# Patient Record
Sex: Female | Born: 1985 | Race: White | Hispanic: No | Marital: Single | Smoking: Former smoker
Health system: Southern US, Community
[De-identification: ages and names within clinical notes are randomized; demographics above are authoritative.]

## PROBLEM LIST (undated history)

## (undated) ENCOUNTER — Emergency Department (HOSPITAL_COMMUNITY): Admission: EM | Payer: BC Managed Care – PPO | Source: Home / Self Care

## (undated) DIAGNOSIS — J45909 Unspecified asthma, uncomplicated: Secondary | ICD-10-CM

---
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
## 2007-04-27 ENCOUNTER — Emergency Department: Payer: Self-pay | Admitting: Emergency Medicine

## 2007-04-29 ENCOUNTER — Emergency Department: Payer: Self-pay | Admitting: Emergency Medicine

## 2008-11-29 IMAGING — CR DG CHEST 2V
1 series · 2 of 2 positions shown · non-contrast
Comparison: none

REASON FOR EXAM: Cough, fever
COMMENTS:

[Series 1: view not recorded · 0.17mm/px · 2 of 2 slices shown]
[im 1/2]
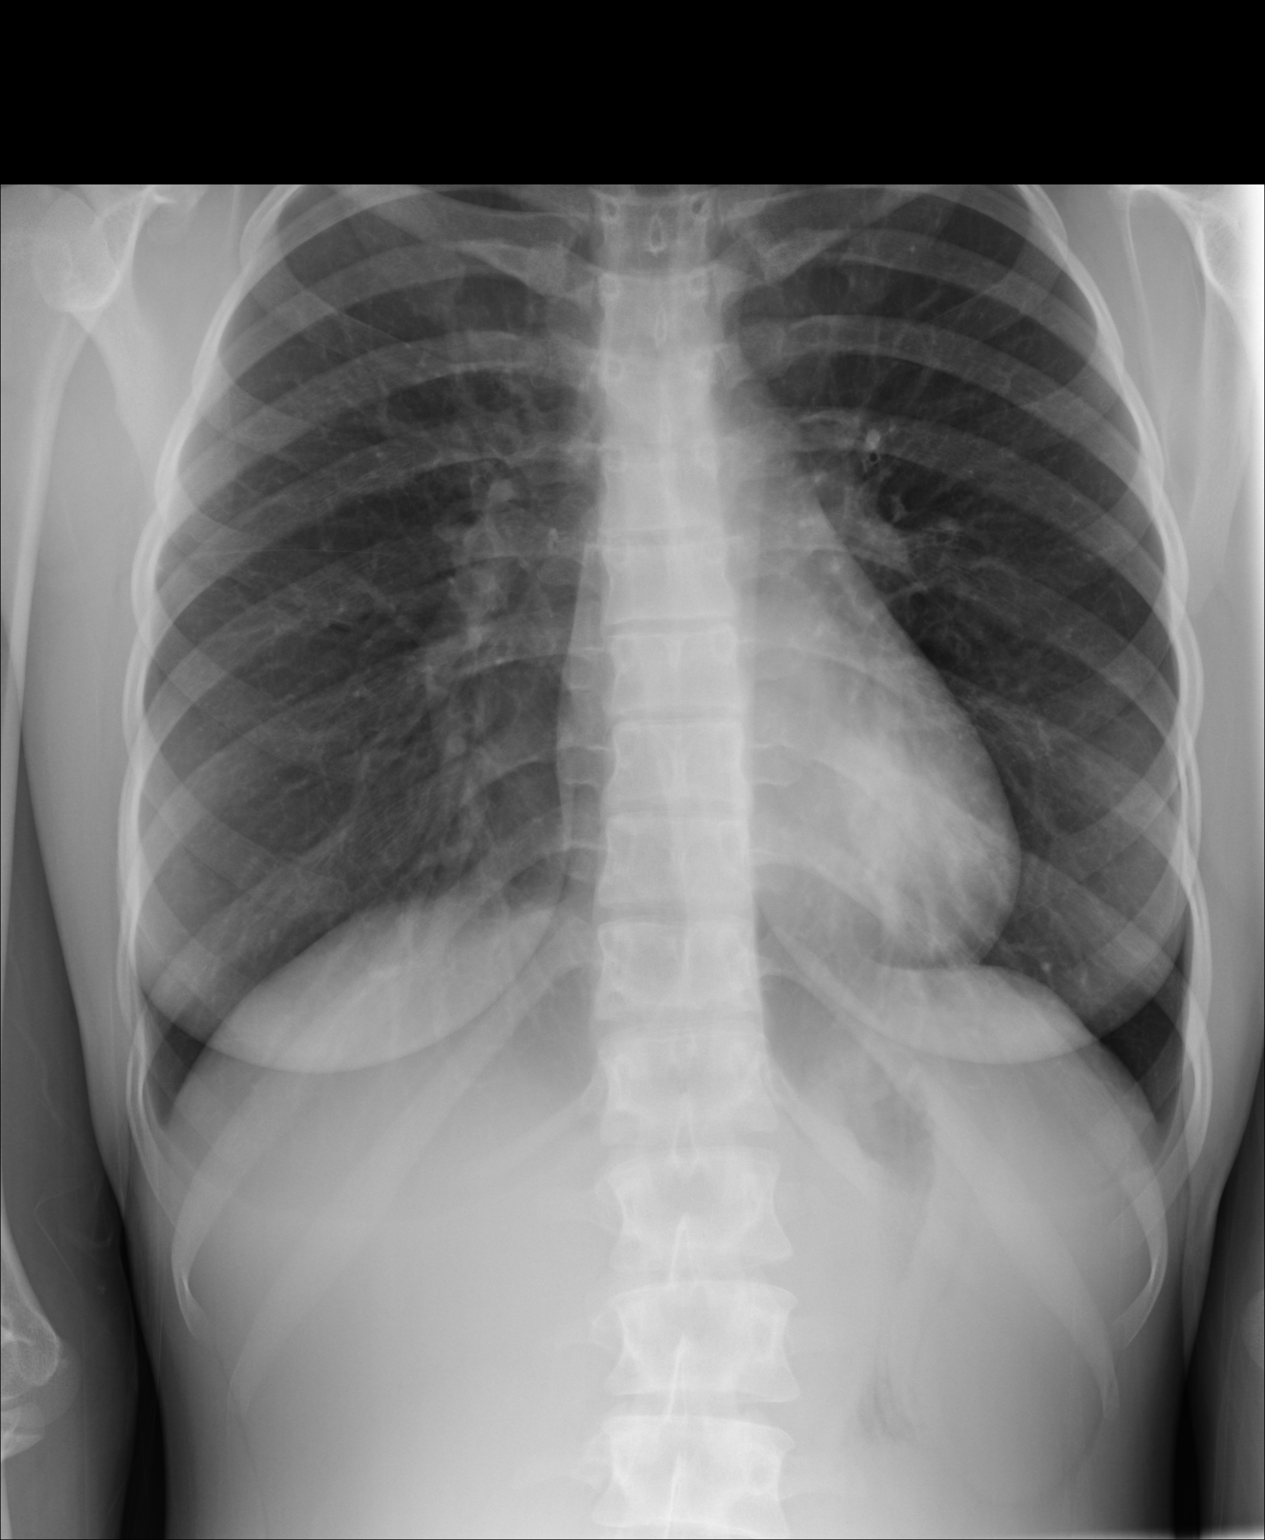
[im 2/2]
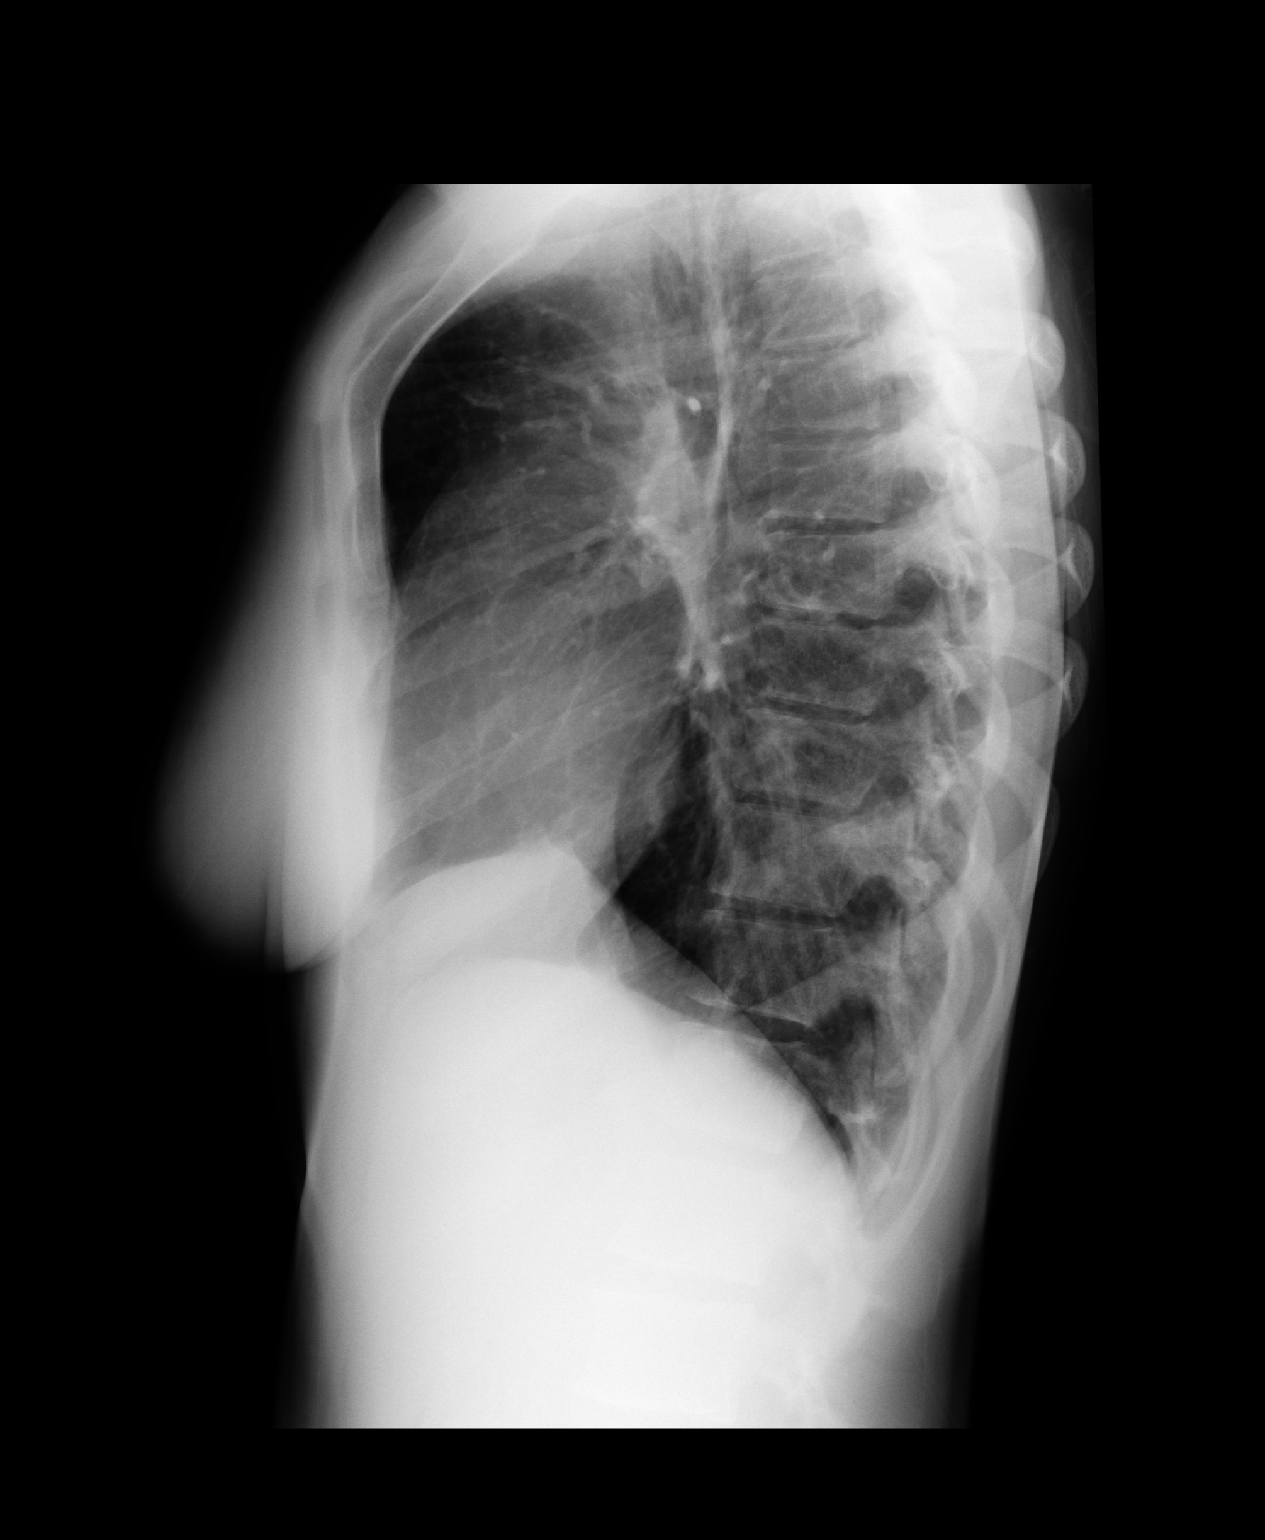

[2 of 2 positions shown; findings below may reference images not displayed]

PROCEDURE:     DXR - DXR CHEST PA (OR AP) AND LATERAL  - April 29, 2007  [DATE]

RESULT:     There is increased density in the LEFT base as visualized
through the cardiac silhouette. Some of the density is secondary to vascular
markings but in addition, there may be a superimposed focal infiltrate
posteriorly in the chest. The possibility of early pneumonia cannot be
excluded. Follow-up examination is recommended. The RIGHT lung field is
clear. The heart size is normal. The chest is hyperexpanded consistent with
reactive airway disease.
IMPRESSION: 1.  Probable early LEFT basilar pneumonia.
2.  The chest is hyperexpanded compatible with reactive airway disease.

## 2011-04-01 ENCOUNTER — Emergency Department: Payer: Self-pay | Admitting: Emergency Medicine

## 2011-10-22 ENCOUNTER — Emergency Department: Payer: Self-pay | Admitting: Emergency Medicine

## 2011-10-22 LAB — URINALYSIS, COMPLETE
Bilirubin,UR: NEGATIVE
Glucose,UR: NEGATIVE mg/dL (ref 0–75)
Nitrite: POSITIVE
Protein: 100
RBC,UR: 9 /HPF (ref 0–5)
Specific Gravity: 1.01 (ref 1.003–1.030)
Squamous Epithelial: 1

## 2011-10-22 LAB — COMPREHENSIVE METABOLIC PANEL
Alkaline Phosphatase: 122 U/L (ref 50–136)
BUN: 5 mg/dL — ABNORMAL LOW (ref 7–18)
Bilirubin,Total: 0.9 mg/dL (ref 0.2–1.0)
Chloride: 105 mmol/L (ref 98–107)
Creatinine: 0.55 mg/dL — ABNORMAL LOW (ref 0.60–1.30)
EGFR (African American): >60
Glucose: 102 mg/dL — ABNORMAL HIGH (ref 65–99)
SGOT(AST): 24 U/L (ref 15–37)
SGPT (ALT): 40 U/L (ref 12–78)
Sodium: 139 mmol/L (ref 136–145)
Total Protein: 7.7 g/dL (ref 6.4–8.2)

## 2011-10-22 LAB — CBC
HCT: 40.7 % (ref 35.0–47.0)
HGB: 14.6 g/dL (ref 12.0–16.0)
MCH: 31.4 pg (ref 26.0–34.0)
MCHC: 35.9 g/dL (ref 32.0–36.0)
MCV: 87 fL (ref 80–100)
RDW: 12.7 % (ref 11.5–14.5)

## 2011-10-22 LAB — LIPASE, BLOOD: Lipase: 77 U/L (ref 73–393)

## 2011-12-28 ENCOUNTER — Emergency Department: Payer: Self-pay | Admitting: Unknown Physician Specialty

## 2011-12-28 LAB — URINALYSIS, COMPLETE
Bilirubin,UR: NEGATIVE
Glucose,UR: NEGATIVE mg/dL (ref 0–75)
Ketone: NEGATIVE
Leukocyte Esterase: NEGATIVE
Nitrite: NEGATIVE
Ph: 6 (ref 4.5–8.0)
Protein: NEGATIVE
RBC,UR: 3 /HPF (ref 0–5)
Specific Gravity: 1.023 (ref 1.003–1.030)

## 2011-12-28 LAB — COMPREHENSIVE METABOLIC PANEL
Albumin: 4.2 g/dL (ref 3.4–5.0)
Alkaline Phosphatase: 85 U/L (ref 50–136)
Anion Gap: 9 (ref 7–16)
BUN: 10 mg/dL (ref 7–18)
Chloride: 107 mmol/L (ref 98–107)
Co2: 24 mmol/L (ref 21–32)
Creatinine: 0.55 mg/dL — ABNORMAL LOW (ref 0.60–1.30)
EGFR (African American): >60
EGFR (Non-African Amer.): >60
Osmolality: 279 (ref 275–301)
Potassium: 4.2 mmol/L (ref 3.5–5.1)
SGOT(AST): 26 U/L (ref 15–37)
SGPT (ALT): 32 U/L (ref 12–78)
Sodium: 140 mmol/L (ref 136–145)
Total Protein: 7.1 g/dL (ref 6.4–8.2)

## 2011-12-28 LAB — DRUG SCREEN, URINE
Amphetamines, Ur Screen: NEGATIVE (ref ?–1000)
Barbiturates, Ur Screen: NEGATIVE (ref ?–200)
Benzodiazepine, Ur Scrn: NEGATIVE (ref ?–200)
MDMA (Ecstasy)Ur Screen: NEGATIVE (ref ?–500)
Methadone, Ur Screen: NEGATIVE (ref ?–300)
Phencyclidine (PCP) Ur S: NEGATIVE (ref ?–25)
Tricyclic, Ur Screen: NEGATIVE (ref ?–1000)

## 2011-12-28 LAB — CBC
HCT: 41.7 % (ref 35.0–47.0)
MCV: 88 fL (ref 80–100)
Platelet: 192 10*3/uL (ref 150–440)
RBC: 4.72 10*6/uL (ref 3.80–5.20)
RDW: 13 % (ref 11.5–14.5)
WBC: 11.8 10*3/uL — ABNORMAL HIGH (ref 3.6–11.0)

## 2011-12-28 LAB — PREGNANCY, URINE: Pregnancy Test, Urine: NEGATIVE m[IU]/mL

## 2013-01-27 HISTORY — PX: TUBAL LIGATION: SHX77

## 2013-02-08 ENCOUNTER — Observation Stay: Payer: Self-pay | Admitting: Obstetrics & Gynecology

## 2013-03-16 ENCOUNTER — Inpatient Hospital Stay: Payer: Self-pay | Admitting: Obstetrics & Gynecology

## 2013-03-16 LAB — CBC WITH DIFFERENTIAL/PLATELET
Basophil #: 0 10*3/uL (ref 0.0–0.1)
Basophil %: 0.3 %
Eosinophil #: 0.1 10*3/uL (ref 0.0–0.7)
Eosinophil %: 1.2 %
HCT: 37.6 % (ref 35.0–47.0)
HGB: 12.9 g/dL (ref 12.0–16.0)
LYMPHS ABS: 1.8 10*3/uL (ref 1.0–3.6)
Lymphocyte %: 16.3 %
MCH: 31.9 pg (ref 26.0–34.0)
MCHC: 34.4 g/dL (ref 32.0–36.0)
MCV: 93 fL (ref 80–100)
MONO ABS: 0.9 x10 3/mm (ref 0.2–0.9)
Monocyte %: 7.9 %
NEUTROS ABS: 8.5 10*3/uL — AB (ref 1.4–6.5)
Neutrophil %: 74.3 %
Platelet: 191 10*3/uL (ref 150–440)
RBC: 4.05 10*6/uL (ref 3.80–5.20)
RDW: 13.4 % (ref 11.5–14.5)
WBC: 11.4 10*3/uL — AB (ref 3.6–11.0)

## 2013-03-17 LAB — GC/CHLAMYDIA PROBE AMP

## 2013-03-19 LAB — HEMATOCRIT: HCT: 34.7 % — ABNORMAL LOW (ref 35.0–47.0)

## 2013-03-23 LAB — PATHOLOGY REPORT

## 2013-07-30 IMAGING — US US PELV - US TRANSVAGINAL
1 series · 13 of 19 positions shown · non-contrast
Comparison: none

REASON FOR EXAM: severe lower abdominal pain
COMMENTS:   May transport without cardiac monitor

[Series 1: us pelv - us transvaginal · 0.28mm/px · 13 of 19 slices shown]
[im 1/19]
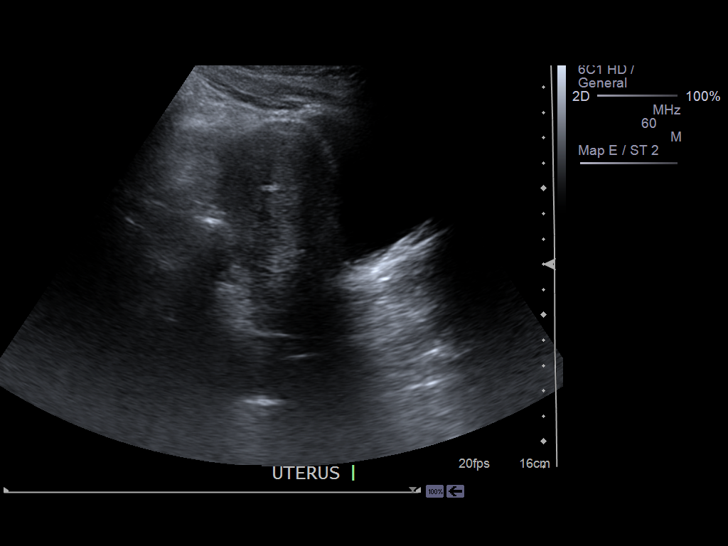
[im 3/19]
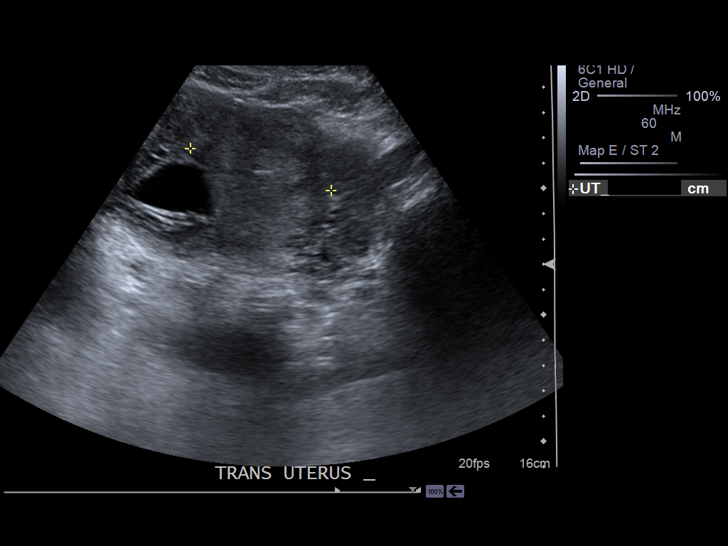
[im 4/19]
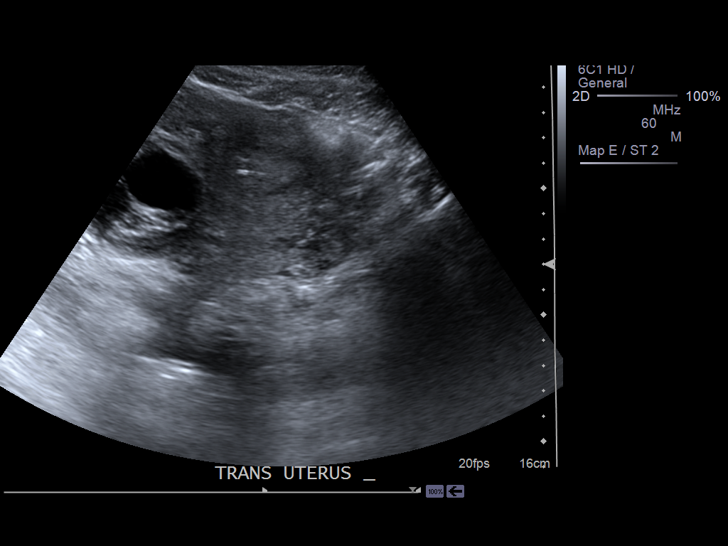
[im 6/19]
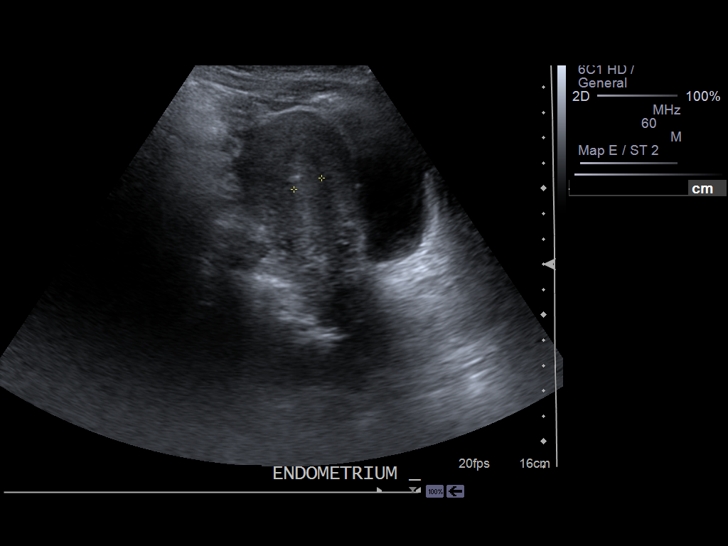
[im 7/19]
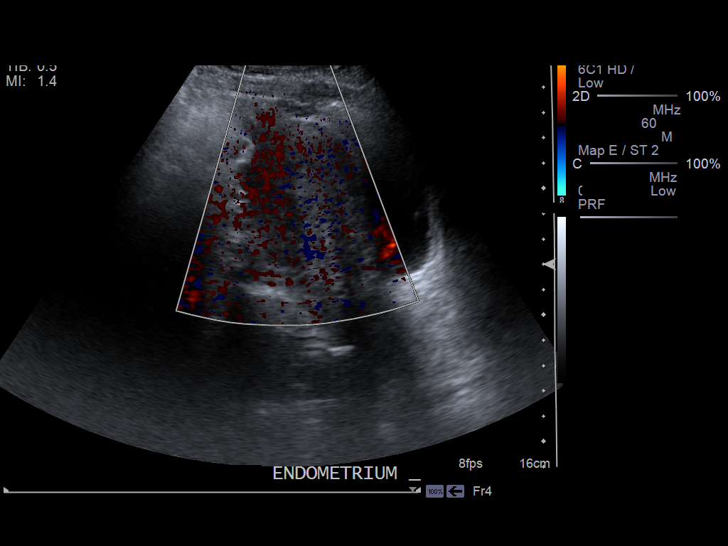
[im 9/19]
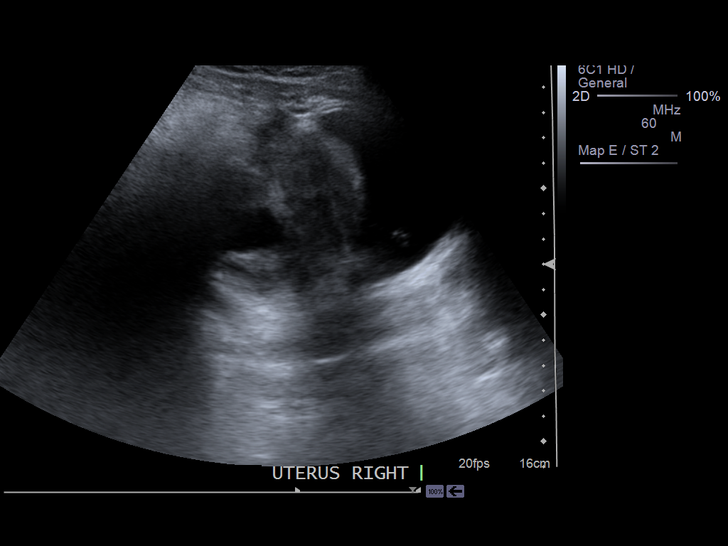
[im 10/19]
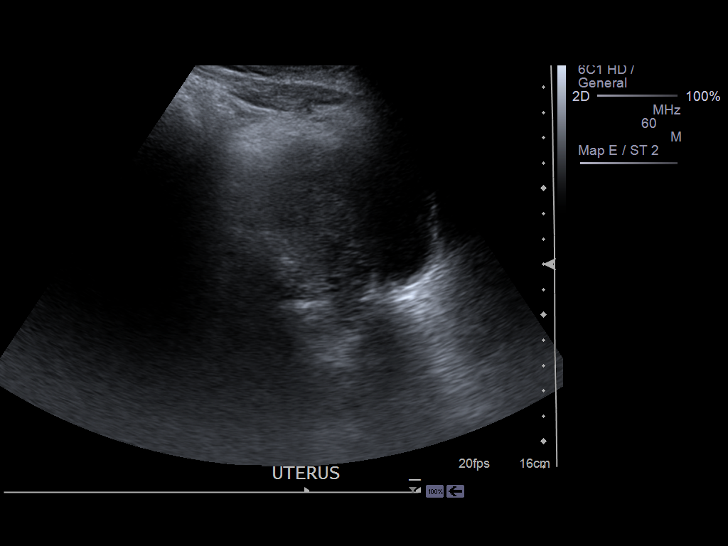
[im 11/19]
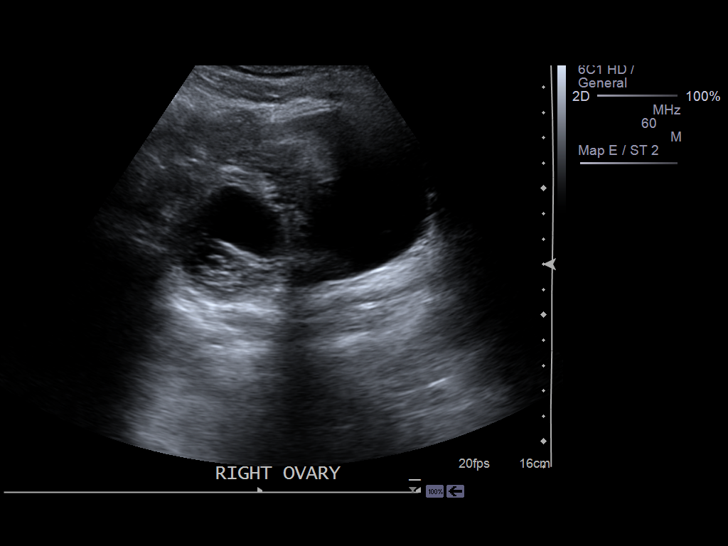
[im 13/19]
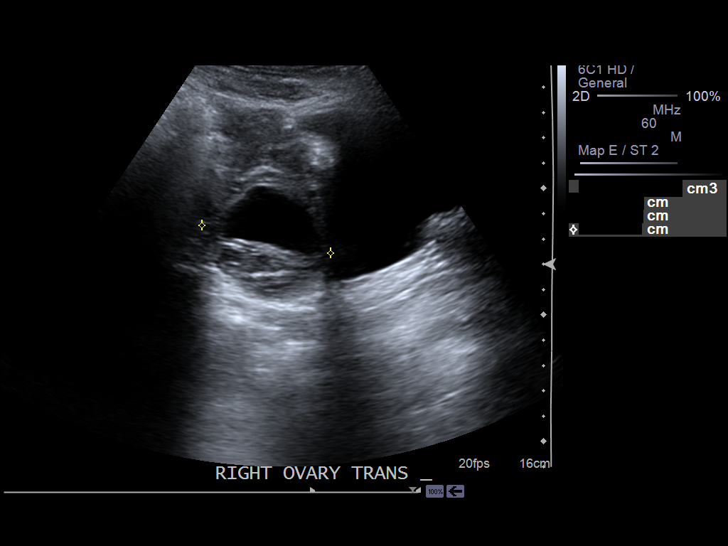
[im 14/19]
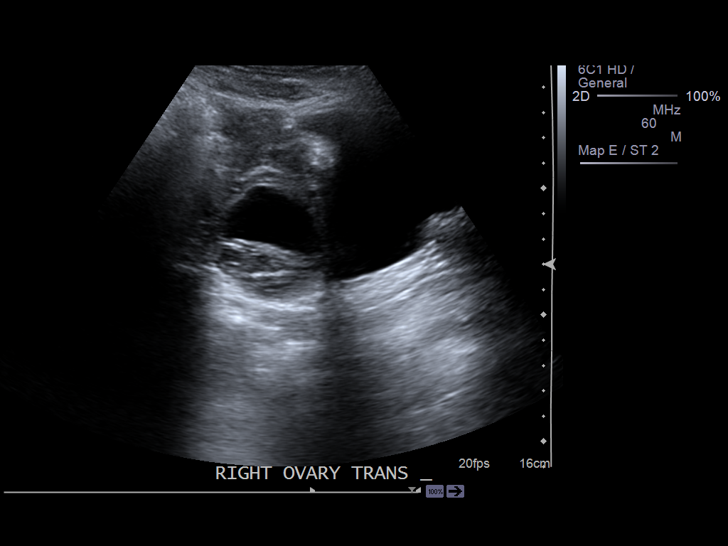
[im 16/19]
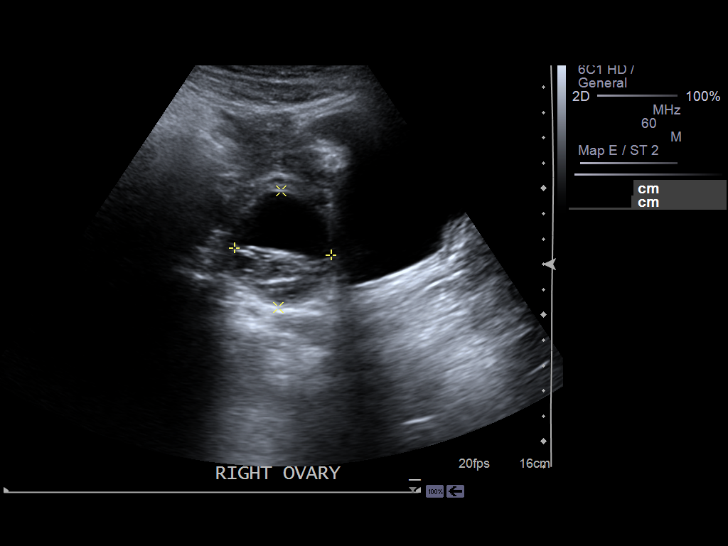
[im 17/19]
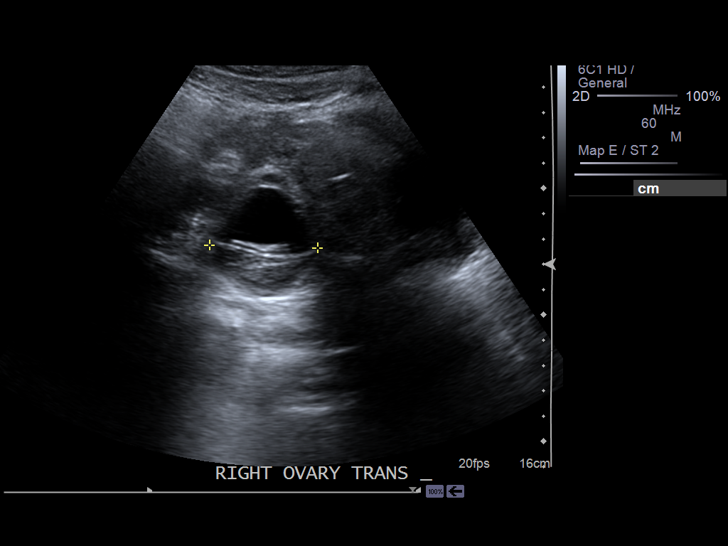
[im 19/19]
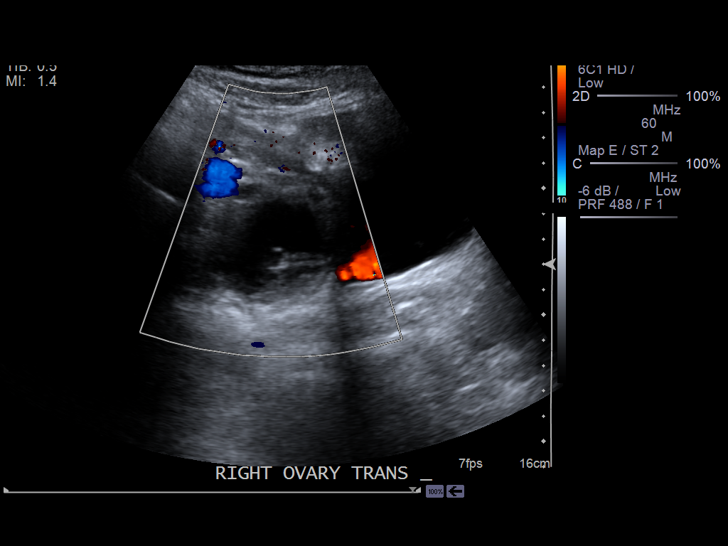

[13 of 19 positions shown; findings below may reference images not displayed]

PROCEDURE:     US  - US PELVIS EXAM W/TRANSVAGINAL  - December 28, 2011  [DATE]

RESULT:     Transabdominal and transvaginal imaging was performed through
the pelvis.

The uterus measures 8.5 x 5.8 x 5 cm. The endometrial stripe measures
mm. There is an IUD in place. There is free fluid in the cul-de-sac and
Morrison's pouch.

The right ovary contains a mixed echogenicity mass measuring 6.6 x 3.4 x
cm. It does not appear hypervascular. The right ovary measures 7.6 x 4.6 x
5.7 cm. The left ovary is normal in echotexture and contour and measures
x 2 x 2.4 cm. Incidental note is made of a possible gallstone.
IMPRESSION: 1. There is an abnormal mass associated with the enlarged right ovary. It
demonstrates mixed echogenicity and is not hypervascular as might be seen
with an ectopic pregnancy. The findings may reflect a complicated
hemorrhagic cyst or could reflect a tubo-ovarian abscess or endometrioma in
the appropriate clinical setting. The left ovary is normal in appearance.
2. The uterus is normal in contour. There is an IUD in place.
3. There is free fluid in the pelvis and in Morison's pouch.
4. There may be a gallstone present.

[REDACTED]

## 2014-05-20 NOTE — Op Note (Signed)
PATIENT NAME:  Nancy [name], Nancy [name] MR#:  [id number] DATE OF BIRTH:  [birth year]  DATE OF PROCEDURE:  03/19/2013  PREOPERATIVE DIAGNOSIS: Desire for permanent sterility.   POSTOPERATIVE DIAGNOSIS: Desire for permanent sterility.   PROCEDURE PERFORMED: Postpartum bilateral tubal ligation using the Pomeroy technique.   SURGEON: Annamarie MajorPaul Mariapaula Krist, M.D.   ANESTHESIA: General.   ESTIMATED BLOOD LOSS: Minimal.   COMPLICATIONS: None.   SPECIMEN: Portion of left and right fallopian tubes.   DISPOSITION: To recovery room, stable.   TECHNIQUE: The patient is prepped and draped in the usual sterile fashion after adequate anesthesia is obtained in the supine position on the operating room table. Marcaine 0.5% is used to anesthetize the skin just inferior to the umbilicus followed by [name] small elliptical incision in the same area. Fascia is identified and tented anteriorly and incised using Mayo scissors. Examination reveals no adhesions or bowel in this vicinity.  The uterus is noted to be firm and at the level of the umbilicus at this point in her recovery. The patient is placed in Trendelenburg positioning and the right fallopian tube is grasped with [name] Babcock clamp and identified out to its fimbria. [name] midportion is then tied with 2 Vicryl sutures, excised, and cauterized. The left fallopian tube is identified and grasped with Babcock clamp and visualized out to its fimbria. The midportion loop is then tied with 2 Vicryl sutures, excised, and cauterized. The patient is then leveled and the rectus fascia is closed with [name] 2-0 Vicryl suture. Subcutaneous tissues are irrigated and the skin is closed with 4-0 Vicryl suture followed by Dermabond. The patient goes to the recovery room in stable condition having tolerated the procedure well. All sponge, instrument, and needle counts are correct.     ____________________________ R. Annamarie MajorPaul Amyiah Gaba, MD rph:dp D: 03/19/2013 09:22:48 ET T: 03/19/2013 10:14:37  ET JOB#: [id number]  cc: Dierdre Searles. Paul Erico Stan, MD, <Dictator> Nadara MustardOBERT P Tykiera Raven MD ELECTRONICALLY SIGNED 03/25/2013 12:38

## 2014-06-06 NOTE — H&P (Signed)
L&D Evaluation:  History Expanded:  HPI 29 yo G4P3 at 7535 5/[redacted] weeks EGA w Prenatal Care in [place] but in process of transferring [facility], w vag pressure as well as rectal P and back pain; has been constipated w no BM in a few days.   No othwer complications.  Prior Normal Spontaneous Vaginal Delivery x3.   Gravida 4   Term 3   Patient's Medical History No Chronic Illness   Patient's Surgical History none   Medications Pre Natal Vitamins   Allergies NKDA   Social History none   Family History Non-Contributory   ROS:  ROS All systems were reviewed.  HEENT, CNS, GI, GU, Respiratory, CV, Renal and Musculoskeletal systems were found to be normal.   Exam:  Vital Signs stable   General no apparent distress   Mental Status clear   Abdomen gravid, non-tender   Estimated Fetal Weight Average for gestational age   Back no CVAT   Edema no edema   Pelvic no external lesions, cervix closed and thick   Mebranes Intact   FHT normal rate with no decels   Ucx absent   Impression:  Impression Constipation   Plan:  Plan monitor contractions and for cervical change, Dulcolax recommended at home.  F/U [facility]   Follow Up Appointment need to schedule   Electronic Signatures: Letitia LibraHarris, Robert Paul (MD)  (Signed 13-Jan-15 20:13)  Authored: L&D Evaluation   Last Updated: 13-Jan-15 20:13 by Letitia LibraHarris, Robert Paul (MD)
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
## 2021-02-18 ENCOUNTER — Emergency Department (HOSPITAL_BASED_OUTPATIENT_CLINIC_OR_DEPARTMENT_OTHER)
Admission: EM | Admit: 2021-02-18 | Discharge: 2021-02-18 | Disposition: A | Discharge status: Home / Self Care | Payer: Medicaid Other | Attending: Emergency Medicine | Admitting: Emergency Medicine

## 2021-02-18 ENCOUNTER — Encounter (HOSPITAL_BASED_OUTPATIENT_CLINIC_OR_DEPARTMENT_OTHER): Payer: Self-pay

## 2021-02-18 ENCOUNTER — Emergency Department (HOSPITAL_BASED_OUTPATIENT_CLINIC_OR_DEPARTMENT_OTHER): Payer: Medicaid Other | Admitting: Radiology

## 2021-02-18 ENCOUNTER — Other Ambulatory Visit: Payer: Self-pay

## 2021-02-18 DIAGNOSIS — R0981 Nasal congestion: Secondary | ICD-10-CM | POA: Insufficient documentation

## 2021-02-18 DIAGNOSIS — J189 Pneumonia, unspecified organism: Secondary | ICD-10-CM

## 2021-02-18 DIAGNOSIS — R059 Cough, unspecified: Secondary | ICD-10-CM | POA: Insufficient documentation

## 2021-02-18 DIAGNOSIS — J45909 Unspecified asthma, uncomplicated: Secondary | ICD-10-CM | POA: Insufficient documentation

## 2021-02-18 DIAGNOSIS — F17200 Nicotine dependence, unspecified, uncomplicated: Secondary | ICD-10-CM | POA: Insufficient documentation

## 2021-02-18 DIAGNOSIS — Z20822 Contact with and (suspected) exposure to covid-19: Secondary | ICD-10-CM | POA: Insufficient documentation

## 2021-02-18 LAB — RESP PANEL BY RT-PCR (FLU A&B, COVID) ARPGX2
Influenza A by PCR: NEGATIVE
Influenza B by PCR: NEGATIVE
SARS Coronavirus 2 by RT PCR: NEGATIVE

## 2021-02-18 MED ORDER — BENZONATATE 100 MG PO CAPS: 100.0000 mg | ORAL_CAPSULE | Freq: Three times a day (TID) | ORAL | 0 refills | Status: DC

## 2021-02-18 MED ORDER — DOXYCYCLINE HYCLATE 100 MG PO CAPS: 100.0000 mg | ORAL_CAPSULE | Freq: Two times a day (BID) | ORAL | 0 refills | Status: DC

## 2021-02-18 MED ORDER — AMOXICILLIN 500 MG PO CAPS: 1000.0000 mg | ORAL_CAPSULE | Freq: Three times a day (TID) | ORAL | 0 refills | Status: DC

## 2021-02-18 NOTE — ED Triage Notes (Signed)
Pt presents to the ED with cough. States that she went to the hospital 2 weeks ago and was prescribed prednisone. States that she was negative for covid, strep throat, and flu at that time. Pt states that she is still experiencing dry cough, ear ache, and chills. Pt A&Ox4 at time of triage. VSS.

## 2021-02-18 NOTE — ED Provider Notes (Signed)
[facility] EMERGENCY DEPT Provider Note   CSN: [id number] Arrival date & time: 02/18/21  1826     History  Chief Complaint  Patient presents with   Cough    Nancy [name] is a 36 y.o. female.  36 year old female presents with URI symptoms times several days.  Has had cough and congestion.  No vomiting or diarrhea.  She does use tobacco products and does have history of asthma.  Denies any severe dyspnea on exertion.  No treatment use prior to arrival.      Home Medications Prior to Admission medications   Not on File      Allergies    Doxylamine and Pseudoephedrine-ibuprofen    Review of Systems   Review of Systems  All other systems reviewed and are negative.  Physical Exam Updated Vital Signs BP 128/74 (BP Location: Left Arm)    Pulse (!) 114    Temp 99.3 F (37.4 C)    Resp 18    Ht 1.651 m (5\' 5" )    Wt 69.9 kg    LMP 02/18/2021 (Exact Date)    SpO2 98%    BMI 25.63 kg/m  Physical Exam Vitals and nursing note reviewed.  Constitutional:      General: She is not in acute distress.    Appearance: Normal appearance. She is well-developed. She is not toxic-appearing.  HENT:     Head: Normocephalic and atraumatic.  Eyes:     General: Lids are normal.     Conjunctiva/sclera: Conjunctivae normal.     Pupils: Pupils are equal, round, and reactive to light.  Neck:     Thyroid: No thyroid mass.     Trachea: No tracheal deviation.  Cardiovascular:     Rate and Rhythm: Normal rate and regular rhythm.     Heart sounds: Normal heart sounds. No murmur heard.   No gallop.  Pulmonary:     Effort: Pulmonary effort is normal. No respiratory distress.     Breath sounds: Normal breath sounds. No stridor. No decreased breath sounds, wheezing, rhonchi or rales.  Abdominal:     General: There is no distension.     Palpations: Abdomen is soft.     Tenderness: There is no abdominal tenderness. There is no rebound.  Musculoskeletal:        General: No  tenderness. Normal range of motion.     Cervical back: Normal range of motion and neck supple.  Skin:    General: Skin is warm and dry.     Findings: No abrasion or rash.  Neurological:     Mental Status: She is alert and oriented to person, place, and time. Mental status is at baseline.     GCS: GCS eye subscore is 4. GCS verbal subscore is 5. GCS motor subscore is 6.     Cranial Nerves: No cranial nerve deficit.     Sensory: No sensory deficit.     Motor: Motor function is intact.  Psychiatric:        Attention and Perception: Attention normal.        Speech: Speech normal.        Behavior: Behavior normal.    ED Results / Procedures / Treatments   Labs (all labs ordered are listed, but only abnormal results are displayed) Labs Reviewed  RESP PANEL BY RT-PCR (FLU A&B, COVID) ARPGX2    EKG None  Radiology DG Chest 2 View  Result Date: 02/18/2021 CLINICAL DATA:  Cough. EXAM: CHEST - 2  VIEW COMPARISON:  04/29/2007 FINDINGS: Moderate pectus excavatum deformity. Midline trachea. Normal heart size and mediastinal contours. No pleural effusion or pneumothorax. Pulmonary interstitial thickening is likely related to the clinical history of smoking. More confluent left base opacity, including retrocardiac density on the lateral view, suspicious for lobar pneumonia. IMPRESSION: Left lower lobe airspace disease, suspicious for pneumonia. Peribronchial thickening which may relate to chronic bronchitis or smoking. Electronically Signed   By: Jeronimo Greaves M.D.   On: 02/18/2021 19:48    Procedures Procedures    Medications Ordered in ED Medications - No data to display  ED Course/ Medical Decision Making/ A&P                           Medical Decision Making Amount and/or Complexity of Data Reviewed Radiology: ordered.   Chest x-ray consistent with pneumonia here.  She is not hypoxic.  COVID and flu test negative.  Will place patient on amoxicillin and  doxycycline        Final Clinical Impression(s) / ED Diagnoses Final diagnoses:  None    Rx / DC Orders ED Discharge Orders     None         Lorre Nick, MD 02/18/21 2126

## 2021-10-30 ENCOUNTER — Other Ambulatory Visit: Payer: Self-pay | Admitting: Family Medicine

## 2021-10-30 ENCOUNTER — Ambulatory Visit
Admission: RE | Admit: 2021-10-30 | Discharge: 2021-10-30 | Disposition: A | Discharge status: Home / Self Care | Payer: BC Managed Care – PPO | Source: Ambulatory Visit | Attending: Family Medicine | Admitting: Family Medicine

## 2021-10-30 DIAGNOSIS — R053 Chronic cough: Secondary | ICD-10-CM

## 2021-10-30 LAB — RESULTS CONSOLE HPV: CHL HPV: NEGATIVE

## 2021-10-30 LAB — HM PAP SMEAR

## 2021-12-31 ENCOUNTER — Other Ambulatory Visit (HOSPITAL_BASED_OUTPATIENT_CLINIC_OR_DEPARTMENT_OTHER): Payer: Self-pay

## 2021-12-31 ENCOUNTER — Emergency Department (HOSPITAL_BASED_OUTPATIENT_CLINIC_OR_DEPARTMENT_OTHER)
Admission: EM | Admit: 2021-12-31 | Discharge: 2021-12-31 | Disposition: A | Discharge status: Home / Self Care | Payer: Self-pay | Attending: Emergency Medicine | Admitting: Emergency Medicine

## 2021-12-31 ENCOUNTER — Emergency Department (HOSPITAL_BASED_OUTPATIENT_CLINIC_OR_DEPARTMENT_OTHER): Payer: Self-pay

## 2021-12-31 ENCOUNTER — Other Ambulatory Visit: Payer: Self-pay

## 2021-12-31 ENCOUNTER — Encounter (HOSPITAL_BASED_OUTPATIENT_CLINIC_OR_DEPARTMENT_OTHER): Payer: Self-pay | Admitting: Emergency Medicine

## 2021-12-31 DIAGNOSIS — K529 Noninfective gastroenteritis and colitis, unspecified: Secondary | ICD-10-CM

## 2021-12-31 DIAGNOSIS — Z1152 Encounter for screening for COVID-19: Secondary | ICD-10-CM | POA: Insufficient documentation

## 2021-12-31 DIAGNOSIS — K8020 Calculus of gallbladder without cholecystitis without obstruction: Secondary | ICD-10-CM

## 2021-12-31 LAB — CBC
HCT: 43.2 % (ref 36.0–46.0)
Hemoglobin: 14.9 g/dL (ref 12.0–15.0)
MCH: 30.4 pg (ref 26.0–34.0)
MCHC: 34.5 g/dL (ref 30.0–36.0)
MCV: 88.2 fL (ref 80.0–100.0)
Platelets: 222 10*3/uL (ref 150–400)
RBC: 4.9 MIL/uL (ref 3.87–5.11)
RDW: 12.5 % (ref 11.5–15.5)
WBC: 8.1 10*3/uL (ref 4.0–10.5)
nRBC: 0 % (ref 0.0–0.2)

## 2021-12-31 LAB — PREGNANCY, URINE: Preg Test, Ur: NEGATIVE

## 2021-12-31 LAB — COMPREHENSIVE METABOLIC PANEL
ALT: 17 U/L (ref 0–44)
AST: 19 U/L (ref 15–41)
Albumin: 4.8 g/dL (ref 3.5–5.0)
Alkaline Phosphatase: 67 U/L (ref 38–126)
Anion gap: 8 (ref 5–15)
BUN: 6 mg/dL (ref 6–20)
CO2: 25 mmol/L (ref 22–32)
Calcium: 10.4 mg/dL — ABNORMAL HIGH (ref 8.9–10.3)
Chloride: 105 mmol/L (ref 98–111)
Creatinine, Ser: 0.54 mg/dL (ref 0.44–1.00)
GFR, Estimated: >60 mL/min (ref >60)
Glucose, Bld: 91 mg/dL (ref 70–99)
Potassium: 3.9 mmol/L (ref 3.5–5.1)
Sodium: 138 mmol/L (ref 135–145)
Total Bilirubin: 0.6 mg/dL (ref 0.3–1.2)
Total Protein: 7.5 g/dL (ref 6.5–8.1)

## 2021-12-31 LAB — URINALYSIS, ROUTINE W REFLEX MICROSCOPIC
Bilirubin Urine: NEGATIVE
Glucose, UA: NEGATIVE mg/dL
Ketones, ur: NEGATIVE mg/dL
Leukocytes,Ua: NEGATIVE
Nitrite: NEGATIVE
RBC / HPF: >50 RBC/hpf — ABNORMAL HIGH (ref 0–5)
Specific Gravity, Urine: 1.009 (ref 1.005–1.030)
pH: 8 (ref 5.0–8.0)

## 2021-12-31 LAB — RESP PANEL BY RT-PCR (FLU A&B, COVID) ARPGX2
Influenza A by PCR: NEGATIVE
Influenza B by PCR: NEGATIVE
SARS Coronavirus 2 by RT PCR: NEGATIVE

## 2021-12-31 LAB — LIPASE, BLOOD: Lipase: 18 U/L (ref 11–51)

## 2021-12-31 MED ORDER — LOPERAMIDE HCL 2 MG PO CAPS
2.0000 mg | ORAL_CAPSULE | Freq: Four times a day (QID) | ORAL | 0 refills | Status: DC | PRN
  Filled 2021-12-31: qty 12, 3d supply, fill #0

## 2021-12-31 MED ORDER — LOPERAMIDE HCL 2 MG PO CAPS
2.0000 mg | ORAL_CAPSULE | Freq: Once | ORAL | Status: AC
  Administered 2021-12-31: 2 mg via ORAL
  Filled 2021-12-31: qty 1

## 2021-12-31 MED ORDER — DICYCLOMINE HCL 10 MG PO CAPS
10.0000 mg | ORAL_CAPSULE | Freq: Once | ORAL | Status: AC
  Administered 2021-12-31: 10 mg via ORAL
  Filled 2021-12-31: qty 1

## 2021-12-31 MED ORDER — ONDANSETRON 4 MG PO TBDP
4.0000 mg | ORAL_TABLET | Freq: Once | ORAL | Status: AC
  Administered 2021-12-31: 4 mg via ORAL
  Filled 2021-12-31: qty 1

## 2021-12-31 MED ORDER — DICYCLOMINE HCL 20 MG PO TABS
20.0000 mg | ORAL_TABLET | Freq: Two times a day (BID) | ORAL | 0 refills | Status: AC
  Filled 2021-12-31: qty 20, 10d supply, fill #0

## 2021-12-31 MED ORDER — ONDANSETRON 4 MG PO TBDP
4.0000 mg | ORAL_TABLET | Freq: Three times a day (TID) | ORAL | 0 refills | Status: DC | PRN
  Filled 2021-12-31: qty 20, 7d supply, fill #0

## 2021-12-31 NOTE — ED Notes (Signed)
Patient verbalizes understanding of discharge instructions. Opportunity for questioning and answers were provided. Patient discharged from ED.  °

## 2021-12-31 NOTE — ED Provider Notes (Signed)
[facility] EMERGENCY DEPT Provider Note   CSN: [id number] Arrival date & time: 12/31/21  1428     History  Chief Complaint  Patient presents with   Abdominal Pain    Nancy [name] is a 36 y.o. female.  With no significant PMH who presents with a few days of generalized fatigue associate with nausea and nonbloody nonbilious emesis, nonbloody diarrhea and lower abdominal discomfort in the setting of exposure to work colleagues with viral illness.  She has not been eating or drinking much because every time she eats or drinks she has nonbloody diarrhea.  She has had also episodes of nonbloody nonbilious emesis.  She has had some lower abdominal cramping and discomfort but also just recently started her menstrual period.  She denies any dysuria or hematuria or history of [name] stones.  She does have history of abdominal surgery but is still passing gas.  She also endorses some mild cough and congestion.  She has not taken any medications for her symptoms.   Abdominal Pain      Home Medications Prior to Admission medications   Medication Sig Start Date End Date Taking? Authorizing Provider  dicyclomine (BENTYL) 20 MG tablet Take 1 tablet (20 mg total) by mouth 2 (two) times daily. 12/31/21  Yes Mardene Sayer, MD  loperamide (IMODIUM) 2 MG capsule Take 1 capsule (2 mg total) by mouth 4 (four) times daily as needed for diarrhea or loose stools. 12/31/21  Yes Mardene Sayer, MD  ondansetron (ZOFRAN-ODT) 4 MG disintegrating tablet Take 1 tablet (4 mg total) by mouth every 8 (eight) hours as needed for nausea or vomiting. 12/31/21  Yes Mardene Sayer, MD  amoxicillin (AMOXIL) 500 MG capsule Take 2 capsules (1,000 mg total) by mouth 3 (three) times daily. 02/18/21   Lorre Nick, MD  benzonatate (TESSALON) 100 MG capsule Take 1 capsule (100 mg total) by mouth every 8 (eight) hours. 02/18/21   Lorre Nick, MD  doxycycline (VIBRAMYCIN) 100 MG capsule Take 1 capsule  (100 mg total) by mouth 2 (two) times daily. 02/18/21   Lorre Nick, MD      Allergies    Doxylamine, Pseudoephedrine-ibuprofen, and Vicks nyquil cough [doxylamine-dm]    Review of Systems   Review of Systems  Gastrointestinal:  Positive for abdominal pain.    Physical Exam Updated Vital Signs BP (!) 141/73   Pulse 81   Temp 98.6 F (37 C)   Resp 18   LMP 12/30/2021 (Exact Date)   SpO2 100%  Physical Exam Constitutional: Alert and oriented.  Nontoxic in no acute distress Eyes: Conjunctivae are normal. ENT      Head: Normocephalic and atraumatic.      Nose: No congestion.      Mouth/Throat: Mucous membranes are moist.      Neck: No stridor. Cardiovascular: S1, S2,  Normal and symmetric distal pulses are present in all extremities.Warm and well perfused. Respiratory: Normal respiratory effort. Breath sounds are normal.  O2 sat 100 RA Gastrointestinal: Soft and mild lower abdominal tenderness, no rebound or guarding, no CVAT Musculoskeletal: Normal range of motion in all extremities.      Right lower leg: No tenderness or edema.      Left lower leg: No tenderness or edema. Neurologic: Normal speech and language. No gross focal neurologic deficits are appreciated. Skin: Skin is warm, dry and intact. No rash noted. Psychiatric: Mood and affect are normal. Speech and behavior are normal.  ED Results / Procedures / Treatments  Labs (all labs ordered are listed, but only abnormal results are displayed) Labs Reviewed  COMPREHENSIVE METABOLIC PANEL - Abnormal; Notable for the following components:      Result Value   Calcium 10.4 (*)    All other components within normal limits  URINALYSIS, ROUTINE W REFLEX MICROSCOPIC - Abnormal; Notable for the following components:   Color, Urine ORANGE (*)    APPearance HAZY (*)    Hgb urine dipstick LARGE (*)    Protein, ur TRACE (*)    RBC / HPF >50 (*)    All other components within normal limits  RESP PANEL BY RT-PCR (FLU A&B,  COVID) ARPGX2  LIPASE, BLOOD  CBC  PREGNANCY, URINE    EKG None  Radiology CT Renal Stone Study  Result Date: 12/31/2021 CLINICAL DATA:  Lower abdominal pain and diarrhea since this morning, not feeling well, question [name] stone, history tubal ligation EXAM: CT ABDOMEN AND PELVIS WITHOUT CONTRAST TECHNIQUE: Multidetector CT imaging of the abdomen and pelvis was performed following the standard protocol without IV contrast. RADIATION DOSE REDUCTION: This exam was performed according to the departmental dose-optimization program which includes automated exposure control, adjustment of the mA and/or kV according to patient size and/or use of iterative reconstruction technique. COMPARISON:  None Available. FINDINGS: Lower chest: Lung bases clear Hepatobiliary: Calcified 6 mm gallstone at lower gallbladder segment. Gallbladder contracted but otherwise normal appearance. Liver normal appearance. Pancreas: Normal appearance Spleen: Normal appearance Adrenals/Urinary Tract: Adrenal glands, kidneys, ureters, and bladder normal appearance. No urinary tract calcification or dilatation. Stomach/Bowel: Normal appendix. Stomach and bowel loops unremarkable for technique Vascular/Lymphatic: Aorta normal caliber.  No adenopathy. Reproductive: Unremarkable uterus and adnexa Other: No free air or free fluid. No hernia or inflammatory process. Musculoskeletal: Unremarkable IMPRESSION: Cholelithiasis. No acute intra-abdominal or intrapelvic abnormalities. Electronically Signed   By: Ulyses Southward M.D.   On: 12/31/2021 17:02    Procedures Procedures    Medications Ordered in ED Medications  ondansetron (ZOFRAN-ODT) disintegrating tablet 4 mg (4 mg Oral Given 12/31/21 1705)  dicyclomine (BENTYL) capsule 10 mg (10 mg Oral Given 12/31/21 1705)  loperamide (IMODIUM) capsule 2 mg (2 mg Oral Given 12/31/21 1705)    ED Course/ Medical Decision Making/ A&P                           Medical Decision Making Nancy [name]  is a 36 y.o. female.  With no significant PMH who presents with a few days of generalized fatigue associate with nausea and nonbloody nonbilious emesis, nonbloody diarrhea and lower abdominal discomfort in the setting of exposure to work colleagues with viral illness.   In the setting of exposure to colleagues with viral illness, suspect likely viral gastroenteritis.  Also consider possible atypical diverticulitis, appendicitis or UTI/pyelonephritis.  Her labs were all generally reassuring and unremarkable.  She had a normal white blood cell count and 0.1, no anemia hemoglobin 14.9.  Lipase 18 within normal limits no concern for pancreatitis.  No transaminitis.  COVID and flu swabs were negative.  UA had large hemoglobin proteinuria and RBCs in the setting of menstrual period.  No findings suggestive of UTI or pyelonephritis.  CT renal stone study obtained with no acute intra-abdominal pathology which I personally reviewed.  Did show evidence of cholelithiasis without cholecystitis.  She has no upper abdominal tenderness no transaminitis, no concern for cholecystitis.  Provided patient Bentyl, Zofran and Imodium for symptom control.  Also provided prescriptions and  advised follow-up with PCP and return precautions.  She is in agreement with plan is for discharge at this time.  She is in agreement with this plan.  Amount and/or Complexity of Data Reviewed Labs: ordered. Radiology: ordered.  Risk Prescription drug management.    Final Clinical Impression(s) / ED Diagnoses Final diagnoses:  Gastroenteritis  Calculus of gallbladder without cholecystitis without obstruction    Rx / DC Orders ED Discharge Orders          Ordered    ondansetron (ZOFRAN-ODT) 4 MG disintegrating tablet  Every 8 hours PRN        12/31/21 1718    dicyclomine (BENTYL) 20 MG tablet  2 times daily        12/31/21 1718    loperamide (IMODIUM) 2 MG capsule  4 times daily PRN        12/31/21 1718               Mardene Sayer, MD 12/31/21 1718

## 2021-12-31 NOTE — ED Triage Notes (Signed)
Lower Abdominal pain and diarrhea started this morning. Just not feeling well.

## 2021-12-31 NOTE — Discharge Instructions (Addendum)
You have been seen in the Emergency Department (ED)  today for nausea and vomiting and diarrhea.  Your work up today has not shown a clear cause for your symptoms, but they may be due to a viral infection. You have been prescribed Zofran; please use as prescribed as needed for your nausea. You have been prescribed imodium for diarrhea and bentyl for abdominal cramping.  Your CT scan was generally reassuring. You do have gallstones but they are not causing any issue.  Follow up with your doctor as soon as possible, ideally within one week, regarding today's emergent visit and your symptoms of nausea/vomiting.   Return to the Emergency Department (ED)  if you develop severe abdominal pain, bloody vomiting, bloody diarrhea, if you are unable to tolerate fluids due to vomiting, or if you develop other symptoms that concern you.

## 2022-01-15 ENCOUNTER — Encounter: Payer: Self-pay | Admitting: Nurse Practitioner

## 2022-01-15 ENCOUNTER — Ambulatory Visit (INDEPENDENT_AMBULATORY_CARE_PROVIDER_SITE_OTHER): Payer: Self-pay | Admitting: Nurse Practitioner

## 2022-01-15 VITALS — BP 115/72 | HR 86 | Temp 98.0°F | Ht 65.0 in | Wt 162.0 lb

## 2022-01-15 DIAGNOSIS — Z09 Encounter for follow-up examination after completed treatment for conditions other than malignant neoplasm: Secondary | ICD-10-CM | POA: Insufficient documentation

## 2022-01-15 DIAGNOSIS — K8070 Calculus of gallbladder and bile duct without cholecystitis without obstruction: Secondary | ICD-10-CM | POA: Insufficient documentation

## 2022-01-15 DIAGNOSIS — L03113 Cellulitis of right upper limb: Secondary | ICD-10-CM | POA: Insufficient documentation

## 2022-01-15 DIAGNOSIS — K529 Noninfective gastroenteritis and colitis, unspecified: Secondary | ICD-10-CM | POA: Insufficient documentation

## 2022-01-15 MED ORDER — METHYLPREDNISOLONE 4 MG PO TBPK: ORAL_TABLET | ORAL | 0 refills | Status: DC

## 2022-01-15 MED ORDER — CEPHALEXIN 500 MG PO CAPS: 500.0000 mg | ORAL_CAPSULE | Freq: Four times a day (QID) | ORAL | 0 refills | Status: AC

## 2022-01-15 NOTE — Assessment & Plan Note (Signed)
Now resolved.  

## 2022-01-15 NOTE — Assessment & Plan Note (Signed)
Medrol dose 4 mg tablet ordered for inflammation. If there is no improvement after completion of Medrol Dosepak patient was told  to pick up the prescription for Keflex and take as prescribed .  Use of warm compresses encouraged.  Avoid scratching site to prevent infection.  Cellulitis of arm, right  - cephALEXin (KEFLEX) 500 MG capsule; Take 1 capsule (500 mg total) by mouth 4 (four) times daily for 5 days.  Dispense: 20 capsule; Refill: 0

## 2022-01-15 NOTE — Assessment & Plan Note (Signed)
presents for follow-up to the emergency room on 12/31/2021 for gastroenteritis.   At emergency room she was discharged home with dicyclomine 20 mg twice daily, Imodium 2 mg, 4 times daily as needed Zofran every 8 hours as needed. Today patient denies abdominal pain, nausea, vomiting, chills, fever.  She stated that she was told that she has gallbladder stone and that she should follow-up with [place].  [facility] but due to lack of insurance she decided to follow-up here.

## 2022-01-15 NOTE — Progress Notes (Signed)
New Patient Office Visit  Subjective:  Patient ID: Nancy [name], female    DOB: Jan 22, 1986  Age: 36 y.o. MRN: [id number]  CC:  Chief Complaint  Patient presents with   Establish Care    HPI Nancy [name] is a 36 y.o. female with past medical history of gastroenteritis, presents for follow-up to the emergency room on 12/31/2021 for gastroenteritis.   At emergency room she was discharged home with dicyclomine 20 mg twice daily, Imodium 2 mg, 4 times daily as needed Zofran every 8 hours as needed. Today patient denies abdominal pain, nausea, vomiting, chills, fever.  She stated that she was told that she has gallbladder stone and that she should follow-up with [place].  [facility] but due to lack of insurance she decided to follow-up here.   Patient complains of right arm swelling, itching, tenderness that started after she got flu vaccine at [facility] 5 days ago.  She denies fever, chills, malaise    Past Surgical History:  Procedure Laterality Date   TUBAL LIGATION Bilateral 2015    No family history on file.  Social History   Socioeconomic History   Marital status: Single    Spouse name: Not on file   Number of children: Not on file   Years of education: Not on file   Highest education level: Not on file  Occupational History   Not on file  Tobacco Use   Smoking status: Every Day    Packs/day: 1.00    Types: Cigarettes   Smokeless tobacco: Not on file  Substance and Sexual Activity   Alcohol use: Not Currently   Drug use: Never   Sexual activity: Not on file  Other Topics Concern   Not on file  Social History Narrative   Not on file   Social Determinants of Health   Financial Resource Strain: Not on file  Food Insecurity: Not on file  Transportation Needs: Not on file  Physical Activity: Not on file  Stress: Not on file  Social Connections: Not on file  Intimate Partner Violence: Not on file    ROS Review of Systems   Constitutional:  Negative for activity change, appetite change, chills, diaphoresis, fatigue and fever.  Respiratory: Negative.  Negative for apnea, cough, chest tightness, shortness of breath, wheezing and stridor.   Cardiovascular: Negative.  Negative for chest pain, palpitations and leg swelling.  Gastrointestinal:  Negative for abdominal distention, abdominal pain, anal bleeding, blood in stool, constipation, diarrhea, nausea and rectal pain.  Neurological: Negative.  Negative for dizziness, seizures, facial asymmetry, light-headedness, numbness and headaches.  Psychiatric/Behavioral: Negative.  Negative for agitation, behavioral problems, confusion, decreased concentration and dysphoric mood.     Objective:   Today's Vitals: BP 115/72   Pulse 86   Temp 98 F (36.7 C)   Ht 5\' 5"  (1.651 m)   Wt 162 lb (73.5 kg)   LMP 12/30/2021 (Exact Date)   SpO2 100%   BMI 26.96 kg/m   Physical Exam Constitutional:      General: She is not in acute distress.    Appearance: She is not ill-appearing, toxic-appearing or diaphoretic.  Eyes:     General: No scleral icterus.       Right eye: No discharge.        Left eye: No discharge.     Extraocular Movements: Extraocular movements intact.     Conjunctiva/sclera: Conjunctivae normal.  Cardiovascular:     Rate and Rhythm:  Normal rate and regular rhythm.     Pulses: Normal pulses.     Heart sounds: Normal heart sounds. No murmur heard.    No friction rub. No gallop.  Pulmonary:     Effort: Pulmonary effort is normal. No respiratory distress.     Breath sounds: Normal breath sounds. No stridor. No wheezing, rhonchi or rales.  Chest:     Chest wall: No tenderness.  Abdominal:     General: There is no distension.     Palpations: Abdomen is soft. There is no mass.     Tenderness: There is no abdominal tenderness. There is no right CVA tenderness, guarding or rebound.     Hernia: No hernia is present.  Skin:    General: Skin is warm and  dry.     Findings: Erythema present.     Comments: Right upper arm appears red and swollen, has  tenderness on palpation, has palpable radial pulse.  Sensation is intact.  No drainage noted  Neurological:     Mental Status: She is alert and oriented to person, place, and time.     Cranial Nerves: No cranial nerve deficit.     Motor: No weakness.     Coordination: Coordination normal.     Gait: Gait normal.  Psychiatric:        Mood and Affect: Mood normal.        Behavior: Behavior normal.        Thought Content: Thought content normal.        Judgment: Judgment normal.     Assessment & Plan:   Problem List Items Addressed This Visit       Digestive   Calculus of gallbladder and bile duct without cholecystitis or obstruction    Incidentally found on imaging study done at emergency room Patient denies abdominal pain, nausea, vomiting, fever, chills. I discussed with the patient that no indication for surgery at this time if she started experiencing abdominal pain she should discuss with her PCP for possible referral to GI for surgery      Gastroenteritis    Now resolved.         Other   Cellulitis of arm, right - Primary    Medrol dose 4 mg tablet ordered for inflammation. If there is no improvement after completion of Medrol Dosepak patient was told  to pick up the prescription for Keflex and take as prescribed .  Use of warm compresses encouraged.  Avoid scratching site to prevent infection.  Cellulitis of arm, right  - cephALEXin (KEFLEX) 500 MG capsule; Take 1 capsule (500 mg total) by mouth 4 (four) times daily for 5 days.  Dispense: 20 capsule; Refill: 0        Relevant Medications   cephALEXin (KEFLEX) 500 MG capsule   Encounter for examination following treatment at hospital    presents for follow-up to the emergency room on 12/31/2021 for gastroenteritis.   At emergency room she was discharged home with dicyclomine 20 mg twice daily, Imodium 2 mg, 4 times  daily as needed Zofran every 8 hours as needed. Today patient denies abdominal pain, nausea, vomiting, chills, fever.  She stated that she was told that she has gallbladder stone and that she should follow-up with [place].  [facility] but due to lack of insurance she decided to follow-up here.        Outpatient Encounter Medications as of 01/15/2022  Medication Sig   cephALEXin (KEFLEX)  500 MG capsule Take 1 capsule (500 mg total) by mouth 4 (four) times daily for 5 days.   methylPREDNISolone (MEDROL DOSEPAK) 4 MG TBPK tablet Take as instructed on packaging   benzonatate (TESSALON) 100 MG capsule Take 1 capsule (100 mg total) by mouth every 8 (eight) hours. (Patient not taking: Reported on 01/15/2022)   dicyclomine (BENTYL) 20 MG tablet Take 1 tablet (20 mg total) by mouth 2 (two) times daily. (Patient not taking: Reported on 01/15/2022)   loperamide (IMODIUM) 2 MG capsule Take 1 capsule (2 mg total) by mouth 4 (four) times daily as needed for diarrhea or loose stools. (Patient not taking: Reported on 01/15/2022)   ondansetron (ZOFRAN-ODT) 4 MG disintegrating tablet Take 1 tablet (4 mg total) by mouth every 8 (eight) hours as needed for nausea or vomiting.   [DISCONTINUED] amoxicillin (AMOXIL) 500 MG capsule Take 2 capsules (1,000 mg total) by mouth 3 (three) times daily. (Patient not taking: Reported on 01/15/2022)   [DISCONTINUED] doxycycline (VIBRAMYCIN) 100 MG capsule Take 1 capsule (100 mg total) by mouth 2 (two) times daily. (Patient not taking: Reported on 01/15/2022)   No facility-administered encounter medications on file as of 01/15/2022.    Follow-up: No follow-ups on file.   Donell Beers, FNP

## 2022-01-15 NOTE — Assessment & Plan Note (Signed)
Incidentally found on imaging study done at emergency room Patient denies abdominal pain, nausea, vomiting, fever, chills. I discussed with the patient that no indication for surgery at this time if she started experiencing abdominal pain she should discuss with her PCP for possible referral to GI for surgery

## 2022-01-15 NOTE — Patient Instructions (Signed)
 .   Cellulitis of arm, right - cephALEXin (KEFLEX) 500 MG capsule; Take 1 capsule (500 mg total) by mouth 4 (four) times daily for 5 days.  Dispense: 20 capsule; Refill: 0    It is important that you exercise regularly at least 30 minutes 5 times a week as tolerated  Think about what you will eat, plan ahead. Choose " clean, green, fresh or frozen" over canned, processed or packaged foods which are more sugary, salty and fatty. 70 to 75% of food eaten should be vegetables and fruit. Three meals at set times with snacks allowed between meals, but they must be fruit or vegetables. Aim to eat over a 12 hour period , example 7 am to 7 pm, and STOP after  your last meal of the day. Drink water,generally about 64 ounces per day, no other drink is as healthy. Fruit juice is best enjoyed in a healthy way, by EATING the fruit.  Thanks for choosing [facility] we consider it a privelige to serve you.

## 2022-03-09 ENCOUNTER — Encounter (HOSPITAL_COMMUNITY): Payer: Self-pay | Admitting: Emergency Medicine

## 2022-03-09 ENCOUNTER — Emergency Department (HOSPITAL_COMMUNITY)
Admission: EM | Admit: 2022-03-09 | Discharge: 2022-03-09 | Disposition: A | Discharge status: Home / Self Care | Payer: PRIVATE HEALTH INSURANCE | Attending: Emergency Medicine | Admitting: Emergency Medicine

## 2022-03-09 ENCOUNTER — Other Ambulatory Visit: Payer: Self-pay

## 2022-03-09 DIAGNOSIS — H6693 Otitis media, unspecified, bilateral: Secondary | ICD-10-CM | POA: Insufficient documentation

## 2022-03-09 DIAGNOSIS — H6690 Otitis media, unspecified, unspecified ear: Secondary | ICD-10-CM

## 2022-03-09 DIAGNOSIS — Z7951 Long term (current) use of inhaled steroids: Secondary | ICD-10-CM | POA: Diagnosis not present

## 2022-03-09 DIAGNOSIS — J069 Acute upper respiratory infection, unspecified: Secondary | ICD-10-CM | POA: Insufficient documentation

## 2022-03-09 DIAGNOSIS — Z1152 Encounter for screening for COVID-19: Secondary | ICD-10-CM | POA: Diagnosis not present

## 2022-03-09 DIAGNOSIS — J45909 Unspecified asthma, uncomplicated: Secondary | ICD-10-CM | POA: Diagnosis not present

## 2022-03-09 DIAGNOSIS — R509 Fever, unspecified: Secondary | ICD-10-CM | POA: Diagnosis present

## 2022-03-09 HISTORY — DX: Unspecified asthma, uncomplicated: J45.909

## 2022-03-09 LAB — RESP PANEL BY RT-PCR (RSV, FLU A&B, COVID)  RVPGX2
Influenza A by PCR: POSITIVE — AB
Influenza B by PCR: NEGATIVE
Resp Syncytial Virus by PCR: NEGATIVE
SARS Coronavirus 2 by RT PCR: NEGATIVE

## 2022-03-09 MED ORDER — AMOXICILLIN-POT CLAVULANATE 875-125 MG PO TABS: 1.0000 | ORAL_TABLET | Freq: Two times a day (BID) | ORAL | 0 refills | Status: DC

## 2022-03-09 MED ORDER — ALBUTEROL SULFATE HFA 108 (90 BASE) MCG/ACT IN AERS: 1.0000 | INHALATION_SPRAY | Freq: Four times a day (QID) | RESPIRATORY_TRACT | 0 refills | Status: AC | PRN

## 2022-03-09 MED ORDER — PREDNISONE 20 MG PO TABS: 40.0000 mg | ORAL_TABLET | Freq: Every day | ORAL | 0 refills | Status: AC

## 2022-03-09 NOTE — ED Triage Notes (Signed)
Pt c/o bilateral ear pain and fevers X4 days.  She has been taking tylenol every 6hours and still has not had relief.  Pt also c/o a productive cough. Last took tylenol at 10am.

## 2022-03-09 NOTE — Discharge Instructions (Addendum)
Please follow-up with your primary care doctor if you have worsening shortness of breath, oxygen saturations below 90% please return to the ER.

## 2022-03-09 NOTE — ED Provider Notes (Signed)
[facility] Provider Note   CSN: [id number] Arrival date & time: 03/09/22  1926     History  Chief Complaint  Patient presents with   Otalgia   Cough    Nancy [name] is a 37 y.o. female, history of asthma, who presents to the ED secondary to bilateral ear pain, over the last 4 days, associated fever.  She also states that she feels like her face is a bit swollen.  She states that she is having difficulty hearing out of her right ear, and that is extremely painful to the touch.  Started with runny nose, cough, and is just progressively gotten worse.  Has been taking Tylenol without any relief.  Also states that she has been more short of breath and has been using her albuterol inhaler multiple times a day without any relief.  Last took Tylenol at 10 AM today.  Denies any nausea, vomiting, diarrhea.     Home Medications Prior to Admission medications   Medication Sig Start Date End Date Taking? Authorizing Provider  albuterol (VENTOLIN HFA) 108 (90 Base) MCG/ACT inhaler Inhale 1-2 puffs into the lungs every 6 (six) hours as needed for wheezing or shortness of breath. 03/09/22  Yes Nusayba Cadenas L, PA  amoxicillin-clavulanate (AUGMENTIN) 875-125 MG tablet Take 1 tablet by mouth every 12 (twelve) hours. 03/09/22  Yes Ashlynn Gunnels L, PA  predniSONE (DELTASONE) 20 MG tablet Take 2 tablets (40 mg total) by mouth daily for 5 days. 03/09/22 03/14/22 Yes Ramal Eckhardt L, PA  benzonatate (TESSALON) 100 MG capsule Take 1 capsule (100 mg total) by mouth every 8 (eight) hours. Patient not taking: Reported on 01/15/2022 02/18/21   Lacretia Leigh, MD  dicyclomine (BENTYL) 20 MG tablet Take 1 tablet (20 mg total) by mouth 2 (two) times daily. Patient not taking: Reported on 01/15/2022 12/31/21   Elgie Congo, MD  loperamide (IMODIUM) 2 MG capsule Take 1 capsule (2 mg total) by mouth 4 (four) times daily as needed for diarrhea or loose stools. Patient not  taking: Reported on 01/15/2022 12/31/21   Elgie Congo, MD  methylPREDNISolone (MEDROL DOSEPAK) 4 MG TBPK tablet Take as instructed on packaging 01/15/22   Renee Rival, FNP  ondansetron (ZOFRAN-ODT) 4 MG disintegrating tablet Take 1 tablet (4 mg total) by mouth every 8 (eight) hours as needed for nausea or vomiting. 12/31/21   Elgie Congo, MD      Allergies    Doxylamine, Phenylephrine-dm-gg-apap, Pseudoephedrine-ibuprofen, and Vicks nyquil cough [doxylamine-dm]    Review of Systems   Review of Systems  Constitutional:  Positive for fever.  HENT:  Positive for ear pain.   Respiratory:  Positive for cough. Negative for shortness of breath.     Physical Exam Updated Vital Signs BP 123/64   Pulse 88   Temp 99.3 F (37.4 C)   Resp 18   Ht 5' 5"$  (1.651 m)   Wt 73.9 kg   SpO2 99%   BMI 27.12 kg/m  Physical Exam Vitals and nursing note reviewed.  Constitutional:      General: She is not in acute distress.    Appearance: She is well-developed.  HENT:     Head: Normocephalic and atraumatic.     Right Ear: Ear canal and external ear normal. Decreased hearing noted. Tenderness present. No drainage. No mastoid tenderness. Tympanic membrane is erythematous and bulging.     Left Ear: Tympanic membrane normal.     Nose:  Nose normal.     Mouth/Throat:     Mouth: Mucous membranes are moist.     Tonsils: No tonsillar exudate. 2+ on the right. 2+ on the left.  Eyes:     Conjunctiva/sclera: Conjunctivae normal.  Cardiovascular:     Rate and Rhythm: Normal rate and regular rhythm.     Heart sounds: No murmur heard. Pulmonary:     Effort: Pulmonary effort is normal. No respiratory distress.     Breath sounds: Normal breath sounds.  Abdominal:     Palpations: Abdomen is soft.     Tenderness: There is no abdominal tenderness.  Musculoskeletal:        General: No swelling.     Cervical back: Neck supple.  Skin:    General: Skin is warm and dry.     Capillary  Refill: Capillary refill takes less than 2 seconds.  Neurological:     Mental Status: She is alert.  Psychiatric:        Mood and Affect: Mood normal.     ED Results / Procedures / Treatments   Labs (all labs ordered are listed, but only abnormal results are displayed) Labs Reviewed  RESP PANEL BY RT-PCR (RSV, FLU A&B, COVID)  RVPGX2    EKG None  Radiology No results found.  Procedures Procedures   Medications Ordered in ED Medications - No data to display  ED Course/ Medical Decision Making/ A&P   {                            Medical Decision Making Patient is a 37 year old female, here for bilateral ear pain, fevers, runny nose, cough for the last 4 days.  She just states that she has been taking Tylenol without any relief of her symptoms.  Just states she feels bad.  States she had a negative COVID/flu a couple days ago.  On exam she has a positive bulging TM on the right side, concerning for otitis media.  No exudate in the canal.  We will start her on Augmentin.  Additionally she states she has been using her albuterol inhaler frequently, so we will start her on albuterol and prednisone for possible asthma exacerbation.  She is not wheezing on exam, and has nonlabored respirations.  Overall well-appearing.  Risk Prescription drug management.    Final Clinical Impression(s) / ED Diagnoses Final diagnoses:  Viral upper respiratory tract infection  Acute otitis media, unspecified otitis media type    Rx / DC Orders ED Discharge Orders          Ordered    amoxicillin-clavulanate (AUGMENTIN) 875-125 MG tablet  Every 12 hours        03/09/22 2044    albuterol (VENTOLIN HFA) 108 (90 Base) MCG/ACT inhaler  Every 6 hours PRN        03/09/22 2045    predniSONE (DELTASONE) 20 MG tablet  Daily        03/09/22 2045              Clarann Helvey L, PA 03/09/22 2050    Lennice Sites, DO 03/12/22 0813

## 2022-05-14 ENCOUNTER — Ambulatory Visit
Admission: RE | Admit: 2022-05-14 | Discharge: 2022-05-14 | Disposition: A | Discharge status: Home / Self Care | Payer: PRIVATE HEALTH INSURANCE | Source: Ambulatory Visit | Attending: Family Medicine | Admitting: Family Medicine

## 2022-05-14 VITALS — BP 115/71 | HR 87 | Temp 98.2°F | Resp 16

## 2022-05-14 DIAGNOSIS — Z113 Encounter for screening for infections with a predominantly sexual mode of transmission: Secondary | ICD-10-CM | POA: Diagnosis present

## 2022-05-14 DIAGNOSIS — G8929 Other chronic pain: Secondary | ICD-10-CM | POA: Insufficient documentation

## 2022-05-14 DIAGNOSIS — F17200 Nicotine dependence, unspecified, uncomplicated: Secondary | ICD-10-CM | POA: Insufficient documentation

## 2022-05-14 DIAGNOSIS — R519 Headache, unspecified: Secondary | ICD-10-CM | POA: Diagnosis present

## 2022-05-14 MED ORDER — IBUPROFEN 800 MG PO TABS: 800.0000 mg | ORAL_TABLET | Freq: Three times a day (TID) | ORAL | 0 refills | Status: AC

## 2022-05-14 NOTE — Discharge Instructions (Addendum)
We have sent testing for sexually transmitted infections. We will notify you of any positive results once they are received. If required, we will prescribe any medications you might need. °

## 2022-05-14 NOTE — ED Triage Notes (Signed)
Pt c/o vaginal discharge onset ~ September. States she tested BV(+) and was rx "some internal oint" and symptoms have not resolved.   Also requested something to assist smoking cessation.   Also requesting treatment for chronic migraines.   Also states "I hate taking medication."

## 2022-05-16 ENCOUNTER — Encounter: Payer: Self-pay | Admitting: Family Medicine

## 2022-05-16 LAB — CERVICOVAGINAL ANCILLARY ONLY
Bacterial Vaginitis (gardnerella): POSITIVE — AB
Candida Glabrata: NEGATIVE
Candida Vaginitis: NEGATIVE
Chlamydia: NEGATIVE
Comment: NEGATIVE
Comment: NEGATIVE
Comment: NEGATIVE
Comment: NEGATIVE
Comment: NEGATIVE
Comment: NORMAL
Neisseria Gonorrhea: NEGATIVE
Trichomonas: POSITIVE — AB

## 2022-05-16 LAB — HIV ANTIBODY (ROUTINE TESTING W REFLEX): HIV Screen 4th Generation wRfx: NONREACTIVE

## 2022-05-16 LAB — RPR: RPR Ser Ql: NONREACTIVE

## 2022-05-17 ENCOUNTER — Telehealth (HOSPITAL_COMMUNITY): Payer: Self-pay | Admitting: Emergency Medicine

## 2022-05-17 MED ORDER — METRONIDAZOLE 500 MG PO TABS: 500.0000 mg | ORAL_TABLET | Freq: Two times a day (BID) | ORAL | 0 refills | Status: AC

## 2022-05-17 NOTE — ED Provider Notes (Signed)
[facility]   [id number] 05/14/22 Arrival Time: 1815  ASSESSMENT & PLAN:  1. Screening for STDs (sexually transmitted diseases)   2. Chronic intractable headache, unspecified headache type   3. Current every day smoker    Meds ordered this encounter  Medications   ibuprofen (ADVIL) 800 MG tablet    Sig: Take 1 tablet (800 mg total) by mouth 3 (three) times daily with meals.    Dispense:  21 tablet    Refill:  0   Declines empiric BV tx.    Discharge Instructions      We have sent testing for sexually transmitted infections. We will notify you of any positive results once they are received. If required, we will prescribe any medications you might need.      Without s/s of PID.  Labs Reviewed  CERVICOVAGINAL ANCILLARY ONLY - Abnormal; Notable for the following components:      Result Value   Trichomonas Positive (*)    Bacterial Vaginitis (gardnerella) Positive (*)    All other components within normal limits  RPR   Narrative:    Performed at:  [address] [facility] [address] [place], [place]  [id number] Lab Director: Jolene Schimke MD, Phone:  [phone]  HIV ANTIBODY (ROUTINE TESTING W REFLEX)   Narrative:    Performed at:  [address] [facility] [address] [place], [place]  [id number] Lab Director: Jolene Schimke MD, Phone:  [phone]   Overall feel her complaints of chronic headaches and desire to talk to someone about smoking cessation would be better handled by neurology and PCP. She agrees. Neurology referral placed. PCP information given on AVS.    Will notify of any positive results. Instructed to refrain from sexual activity for at least seven days.  Reviewed expectations re: course of current medical issues. Questions answered. Outlined signs and symptoms indicating need for more acute intervention. Patient verbalized understanding. After Visit Summary given.   SUBJECTIVE:  Nancy [name] is a 37 y.o. female who presents  with complaint of vaginal discharge; onset ~ few months ago. States she tested BV(+) and was rx "some internal oint" and symptoms have not resolved. Low worry re: STD. Desires testing.  Also requested something to assist smoking cessation.   Also requesting treatment for chronic migraines. For 1 year approx; sev x per week. Occas limit daily activities.  Also states "I hate taking medication."   OBJECTIVE:  Vitals:   05/14/22 1841  BP: 115/71  Pulse: 87  Resp: 16  Temp: 98.2 F (36.8 C)  TempSrc: Oral  SpO2: 97%    General appearance: alert, cooperative, appears stated age and no distress Lungs: unlabored respirations; speaks full sentences without difficulty Back: no CVA tenderness; FROM at waist Abdomen: soft, non-tender GU: deferred Skin: warm and dry Neuro: CN 2-12 grossly intact; normal extremity strength and movements Psychological: alert and cooperative; normal mood and affect.  Results for orders placed or performed during the hospital encounter of 05/14/22  RPR  Result Value Ref Range   RPR Ser Ql Non Reactive Non Reactive  HIV Antibody (routine testing w rflx)  Result Value Ref Range   HIV Screen 4th Generation wRfx Non Reactive Non Reactive  Cervicovaginal ancillary only  Result Value Ref Range   Neisseria Gonorrhea Negative    Chlamydia Negative    Trichomonas Positive (A)    Bacterial Vaginitis (gardnerella) Positive (A)    Candida Vaginitis Negative    Candida Glabrata Negative    Comment  Normal Reference Range Bacterial Vaginosis - Negative   Comment Normal Reference Range Candida Species - Negative    Comment Normal Reference Range Candida Galbrata - Negative    Comment Normal Reference Range Trichomonas - Negative    Comment Normal Reference Ranger Chlamydia - Negative    Comment      Normal Reference Range Neisseria Gonorrhea - Negative    Labs Reviewed  CERVICOVAGINAL ANCILLARY ONLY - Abnormal; Notable for the following components:       Result Value   Trichomonas Positive (*)    Bacterial Vaginitis (gardnerella) Positive (*)    All other components within normal limits  RPR   Narrative:    Performed at:  [address] [facility] [address], [place], [place]  [id number] Lab Director: Jolene Schimke MD, Phone:  [phone]  HIV ANTIBODY (ROUTINE TESTING W REFLEX)   Narrative:    Performed at:  [address] [facility] [address] [place], [place]  [id number] Lab Director: Jolene Schimke MD, Phone:  [phone]    Allergies  Allergen Reactions   Doxylamine Anaphylaxis   Phenylephrine-Dm-Gg-Apap Anaphylaxis   Vicks Nyquil Cough [Doxylamine-Dm]     Past Medical History:  Diagnosis Date   Asthma    History reviewed. No pertinent family history. Social History   Socioeconomic History   Marital status: Single    Spouse name: Not on file   Number of children: Not on file   Years of education: Not on file   Highest education level: Not on file  Occupational History   Not on file  Tobacco Use   Smoking status: Every Day    Packs/day: 1    Types: Cigarettes   Smokeless tobacco: Not on file  Substance and Sexual Activity   Alcohol use: Not Currently   Drug use: Never   Sexual activity: Not on file  Other Topics Concern   Not on file  Social History Narrative   Not on file   Social Determinants of Health   Financial Resource Strain: Not on file  Food Insecurity: Not on file  Transportation Needs: Not on file  Physical Activity: Not on file  Stress: Not on file  Social Connections: Not on file  Intimate Partner Violence: Not on file           Mardella Layman, MD 05/17/22 [phone]

## 2022-05-21 ENCOUNTER — Ambulatory Visit: Payer: PRIVATE HEALTH INSURANCE | Admitting: Family Medicine

## 2022-05-21 NOTE — Progress Notes (Deleted)
   New Patient Office Visit  Subjective    Patient ID: Nancy [name], female    DOB: 04-17-1985  Age: 37 y.o. MRN: [id number]  CC: No chief complaint on file.   HPI Nancy [name] presents to establish care ***  Metronidazole for BV and trichomoniasis  Hypercalcemia  PMH: ***  PSH: ***  FH: ***  Tobacco use: *** Alcohol use: *** Drug use: *** Marital status: *** Employment: *** Sexual hx: ***  Screenings:  Colon Cancer: *** Lung Cancer: *** Breast Cancer: *** Diabetes: *** HLD: ***   Outpatient Encounter Medications as of 05/21/2022  Medication Sig   albuterol (VENTOLIN HFA) 108 (90 Base) MCG/ACT inhaler Inhale 1-2 puffs into the lungs every 6 (six) hours as needed for wheezing or shortness of breath.   benzonatate (TESSALON) 100 MG capsule Take 1 capsule (100 mg total) by mouth every 8 (eight) hours. (Patient not taking: Reported on 01/15/2022)   dicyclomine (BENTYL) 20 MG tablet Take 1 tablet (20 mg total) by mouth 2 (two) times daily. (Patient not taking: Reported on 01/15/2022)   ibuprofen (ADVIL) 800 MG tablet Take 1 tablet (800 mg total) by mouth 3 (three) times daily with meals.   loperamide (IMODIUM) 2 MG capsule Take 1 capsule (2 mg total) by mouth 4 (four) times daily as needed for diarrhea or loose stools. (Patient not taking: Reported on 01/15/2022)   methocarbamol (ROBAXIN) 750 MG tablet Take by mouth.   metroNIDAZOLE (FLAGYL) 500 MG tablet Take 1 tablet (500 mg total) by mouth 2 (two) times daily.   No facility-administered encounter medications on file as of 05/21/2022.    Past Medical History:  Diagnosis Date   Asthma     Past Surgical History:  Procedure Laterality Date   TUBAL LIGATION Bilateral 2015    No family history on file.  Social History   Socioeconomic History   Marital status: Single    Spouse name: Not on file   Number of children: Not on file   Years of education: Not on file   Highest education level: Not on file   Occupational History   Not on file  Tobacco Use   Smoking status: Every Day    Packs/day: 1    Types: Cigarettes   Smokeless tobacco: Not on file  Substance and Sexual Activity   Alcohol use: Not Currently   Drug use: Never   Sexual activity: Not on file  Other Topics Concern   Not on file  Social History Narrative   Not on file   Social Determinants of Health   Financial Resource Strain: Not on file  Food Insecurity: Not on file  Transportation Needs: Not on file  Physical Activity: Not on file  Stress: Not on file  Social Connections: Not on file  Intimate Partner Violence: Not on file    ROS      Objective    There were no vitals taken for this visit.  Physical Exam  {Labs (Optional):23779}    Assessment & Plan:   Problem List Items Addressed This Visit   None   No follow-ups on file.   Sandre Kitty, MD

## 2022-05-22 ENCOUNTER — Encounter: Payer: Self-pay | Admitting: Neurology

## 2022-05-22 ENCOUNTER — Ambulatory Visit (INDEPENDENT_AMBULATORY_CARE_PROVIDER_SITE_OTHER): Payer: PRIVATE HEALTH INSURANCE | Admitting: Neurology

## 2022-05-22 VITALS — BP 121/71 | HR 123 | Ht 65.0 in | Wt 168.0 lb

## 2022-05-22 DIAGNOSIS — G43009 Migraine without aura, not intractable, without status migrainosus: Secondary | ICD-10-CM | POA: Diagnosis not present

## 2022-05-22 DIAGNOSIS — R519 Headache, unspecified: Secondary | ICD-10-CM

## 2022-05-22 DIAGNOSIS — G4440 Drug-induced headache, not elsewhere classified, not intractable: Secondary | ICD-10-CM

## 2022-05-22 NOTE — Progress Notes (Signed)
Subjective:    Patient ID: Nancy [name] is a 37 y.o. female.  HPI    [name], MD, PhD [facility] [address] [address] [place], [place] [address]  I saw patient, Nancy [name], as a referral from the emergency room for recurrent headaches, concern for migraines.  The patient is unaccompanied today. Nancy [name] is a 37 year old female with an underlying medical history of asthma, recurrent headaches, smoking, history of cellulitis, and overweight state, who reports a history of headaches for the past year and a half.  She has not seen her PCP for these.  She reports that she is scheduled tomorrow to see a new PCP.  She had seen [facility] in December 2023 but for some reason will not follow-up there, she reports that her new PCP is through [facility].  She reports that she has often left-sided headaches, these are throbbing and sometimes behind the left eye.  She has not had a full dilated eye exam in years.  She has the same eyeglasses for about 4 years and has a history of contact lens use.  She had an eye exam through [facility] last year.  She tries to sleep about 7 to 8 hours, usually goes to bed around 9 and has to be up around 6.  She does not always hydrate well with water, estimates that she drinks about 2 cups of water per day.  She does drink 1 cup of coffee in the morning and 2 cups of soda per day.  She drinks alcohol rarely.  She smokes about a pack per day and is interested in smoking cessation.  She is finishing her metronidazole prescription.  She denies any sudden onset of one-sided weakness or numbness or tingling or droopy face or slurring of speech. Sometimes the headache is in the back of her neck, at the base of her skull.  She has been taking Tylenol daily, about 4 extra strength Tylenol per day and when she was seen in urgent care she was given prescription strength ibuprofen 800 mg strength and has been taking 2/day  instead of the Tylenol.  She lives with her sister, she has 4 children, they do not live with her.  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  She presented to the emergency room on 05/14/2022 with a chief complaint of vaginal discharge and wanting to be tested for STDs.  She mentioned a history of headaches for about a year.  She requested help with smoking cessation.  I reviewed the emergency room records. She is being treated for trichomonas and Gardnerella.   Her Past Medical History Is Significant For: Past Medical History:  Diagnosis Date   Asthma     Her Past Surgical History Is Significant For: Past Surgical History:  Procedure Laterality Date   TUBAL LIGATION Bilateral 2015    Her Family History Is Significant For: Family History  Problem Relation Age of Onset   Diabetes Maternal Grandmother    Cancer Paternal Aunt    Migraines Neg Hx     Her Social History Is Significant For: Social History   Socioeconomic History   Marital status: Single    Spouse name: Not on file   Number of children: Not on file   Years of education: Not on file   Highest education level: Not on file  Occupational History   Not on file  Tobacco Use   Smoking status:  Every Day    Packs/day: 1    Types: Cigarettes   Smokeless tobacco: Never  Vaping Use   Vaping Use: Former  Substance and Sexual Activity   Alcohol use: Not Currently    Comment: "every once in awhile"   Drug use: Never   Sexual activity: Not on file  Other Topics Concern   Not on file  Social History Narrative   Lives with her sister   Right handed   Caffeine: 1 cup/day   Social Determinants of Health   Financial Resource Strain: Not on file  Food Insecurity: Not on file  Transportation Needs: Not on file  Physical Activity: Not on file  Stress: Not on file  Social Connections: Not on file    Her Allergies Are:  Allergies  Allergen Reactions   Doxylamine Anaphylaxis    Other Anaphylaxis    DAYQUIL   Phenylephrine-Dm-Gg-Apap Anaphylaxis   Vicks Nyquil Cough [Doxylamine-Dm]   :   Her Current Medications Are:  Outpatient Encounter Medications as of 05/22/2022  Medication Sig   albuterol (VENTOLIN HFA) 108 (90 Base) MCG/ACT inhaler Inhale 1-2 puffs into the lungs every 6 (six) hours as needed for wheezing or shortness of breath.   ibuprofen (ADVIL) 800 MG tablet Take 1 tablet (800 mg total) by mouth 3 (three) times daily with meals.   methocarbamol (ROBAXIN) 750 MG tablet Take by mouth as needed.   metroNIDAZOLE (FLAGYL) 500 MG tablet Take 1 tablet (500 mg total) by mouth 2 (two) times daily.   dicyclomine (BENTYL) 20 MG tablet Take 1 tablet (20 mg total) by mouth 2 (two) times daily. (Patient not taking: Reported on 01/15/2022)   [DISCONTINUED] benzonatate (TESSALON) 100 MG capsule Take 1 capsule (100 mg total) by mouth every 8 (eight) hours. (Patient not taking: Reported on 01/15/2022)   [DISCONTINUED] loperamide (IMODIUM) 2 MG capsule Take 1 capsule (2 mg total) by mouth 4 (four) times daily as needed for diarrhea or loose stools. (Patient not taking: Reported on 01/15/2022)   No facility-administered encounter medications on file as of 05/22/2022.  :   Review of Systems:  Out of a complete 14 point review of systems, all are reviewed and negative with the exception of these symptoms as listed below:   Review of Systems  Neurological:        Patient is here alone for consultation for chronic headaches. She states she has had them for about 1.5 years. She states sometimes her head will hurt so bad she feels like she will be sick. Two weeks ago she had to lay down because she was so nauseated. She has tried Tylenol, Ibuprofen & Motrin, Excedrin, nothing works.    Objective:  Neurological Exam  Physical Exam Physical Examination:   Vitals:   05/22/22 1453  BP: 121/71  Pulse: (!) 123  SpO2: 96%   General Examination: The patient is a very pleasant  37 y.o. female in no acute distress. She appears well-developed and well-nourished and adequately groomed.   HEENT: Normocephalic, atraumatic, pupils are equal, round and reactive to light, corrective eyeglasses in place.  Funduscopic exam is benign.  Extraocular tracking is good without limitation to gaze excursion or nystagmus noted. Hearing is grossly intact. Face is symmetric with normal facial animation. Speech is clear with no dysarthria noted. There is no hypophonia. There is no lip, neck/head, jaw or voice tremor. Neck is supple with full range of passive and active motion. There are no carotid bruits on auscultation. Oropharynx exam reveals:  moderate mouth dryness, adequate dental hygiene. Tongue protrudes centrally and palate elevates symmetrically.   Chest: Clear to auscultation without wheezing, rhonchi or crackles noted.  Heart: S1+S2+0, regular and normal without murmurs, rubs or gallops noted.   Abdomen: Soft, non-tender and non-distended.  Extremities: There is no pitting edema in the distal lower extremities bilaterally.   Skin: Warm and dry without trophic changes noted.   Musculoskeletal: exam reveals no obvious joint deformities.   Neurologically:  Mental status: The patient is awake, alert and oriented in all 4 spheres. Her immediate and remote memory, attention, language skills and fund of knowledge are appropriate. There is no evidence of aphasia, agnosia, apraxia or anomia. Speech is clear with normal prosody and enunciation. Thought process is linear. Mood is normal and affect is normal.  Cranial nerves II - XII are as described above under HEENT exam.  Motor exam: Normal bulk, strength and tone is noted. There is no obvious action or resting tremor.  No postural tremor, no drift or rebound. Romberg is negative. Fine motor skills and coordination: Intact finger taps, hand movements and rapid alternating patting in both upper extremities, intact foot taps.   Reflexes are  2+ throughout, toes are downgoing bilaterally. Cerebellar testing: No dysmetria or intention tremor. There is no truncal or gait ataxia.  Sensory exam: intact to light touch in the upper and lower extremities.  Normal finger-to-nose, normal heel-to-shin bilaterally. Gait, station and balance: She stands easily. No veering to one side is noted. No leaning to one side is noted. Posture is age-appropriate and stance is narrow based. Gait shows normal stride length and normal pace. No problems turning are noted.  Normal tandem walk.  Assessment and Plan:  In summary, Nancy [name] is a very pleasant 37 y.o.-year old female  with an underlying medical history of asthma, recurrent headaches, smoking, history of cellulitis, and overweight state, who presents for evaluation of her recurrent headaches of approximately 1-1/2 years duration.  She probably has a mixed type of headache, including tension headaches, medication overuse headaches, migraine headache, and caffeine related headaches.  She has a tendency to hydrate suboptimally.  She is advised to scale back on her caffeine and limit herself to 1 or 2 servings per day.  We talked about the importance of smoking cessation and maintaining a healthy lifestyle as well.  She is advised to increase her water intake to about 6 to 8 cups of water per day, 8 ounce size each.  She may have to have new eyeglasses.  She is advised to make an appointment with an optometrist or ophthalmologist of her choosing for a full, dilated eye exam and get updated eyeglasses if the need arises.  She has a nonfocal neurological exam.  Since she has mostly left-sided headaches I would like to exclude a structural cause of her headaches by doing a brain MRI with and without contrast.  We will do chemistry panel check in preparation of the contrasted MRI.  We will call her with her brain MRI results.  For as needed use for acute migraine she can use Maxalt 5 mg strength as needed, she can  repeated after 2 hours once, no more than 2 pills in 24 hours, no more than 3 pills a week.  She was given written instructions in my chart.  She is advised to avoid daily over-the-counter medications including Tylenol and/or ibuprofen as daily medication for headaches including over-the-counter medications can perpetuate headaches.  She is advised to follow-up routinely  in this clinic to see one of our nurse practitioners in about 6 months.  She is strongly advised to follow-up with primary care or establish with her new primary care she indicates. I answered all her questions today and she was in agreement. [name], MD, PhD

## 2022-05-22 NOTE — Patient Instructions (Signed)
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 Keep in mind that daily over-the-counter pain medication use including Tylenol and/or ibuprofen can perpetuate headaches.  Please limit your caffeine to 1 or 2 servings per day rather than 3 servings per day.  Caffeine use can also cause headaches or cause withdrawal headaches. Please make an appointment for a full, dilated eye examination.  You may need updated eyeglasses. Please see your primary care provider.  You had seen [facility] in December 2023.  Please hydrate better with water, 6 to 8 cups of water per day are recommended daily, 8 ounce size each, generally speaking.  For acute migraine, you can use Maxalt 5 mg as needed: take 1 pill early on when you suspect a migraine attack come on. You may take another pill within 2 hours, no more than 2 pills in 24 hours.  Most people who take triptans do not have any serious side-effects. However, they can cause drowsiness (remember to not drive or use heavy machinery when drowsy), nausea, dizziness, dry mouth. Less common side effects include strange sensations, such as tightness in your chest or throat, tingling, flushing, and feelings of heaviness or pressure in areas such as the face, limbs, and chest. These in the chest can mimic heart related pain (angina) and may cause alarm, but usually these sensations are not harmful or a sign of a heart attack. However, if you develop intense chest pain or sensations of discomfort, you should stop taking your medication and consult with me or your PCP or go to the nearest urgent care facility or ER or call 911.

## 2022-05-23 ENCOUNTER — Encounter: Payer: Self-pay | Admitting: Neurology

## 2022-05-23 LAB — COMPREHENSIVE METABOLIC PANEL
ALT: 21 IU/L (ref 0–32)
AST: 15 IU/L (ref 0–40)
Albumin/Globulin Ratio: 1.9 (ref 1.2–2.2)
Albumin: 4.1 g/dL (ref 3.9–4.9)
Alkaline Phosphatase: 80 IU/L (ref 44–121)
BUN/Creatinine Ratio: 12 (ref 9–23)
BUN: 7 mg/dL (ref 6–20)
Bilirubin Total: 0.9 mg/dL (ref 0.0–1.2)
CO2: 19 mmol/L — ABNORMAL LOW (ref 20–29)
Calcium: 9.5 mg/dL (ref 8.7–10.2)
Chloride: 98 mmol/L (ref 96–106)
Creatinine, Ser: 0.6 mg/dL (ref 0.57–1.00)
Globulin, Total: 2.2 g/dL (ref 1.5–4.5)
Glucose: 107 mg/dL — ABNORMAL HIGH (ref 70–99)
Potassium: 3.6 mmol/L (ref 3.5–5.2)
Sodium: 133 mmol/L — ABNORMAL LOW (ref 134–144)
Total Protein: 6.3 g/dL (ref 6.0–8.5)
eGFR: 121 mL/min/{1.73_m2} (ref >59)

## 2022-05-26 NOTE — Telephone Encounter (Signed)
Dr Frances Furbish sent a message to patient about her lab results.

## 2022-08-14 ENCOUNTER — Ambulatory Visit: Payer: Self-pay | Admitting: Neurology

## 2022-09-21 IMAGING — DX DG CHEST 2V
2 series · 2 of 2 positions shown · non-contrast
Comparison: 04/29/2007

CLINICAL DATA: Cough.

EXAM:
CHEST - 2 VIEW

[chest pa]
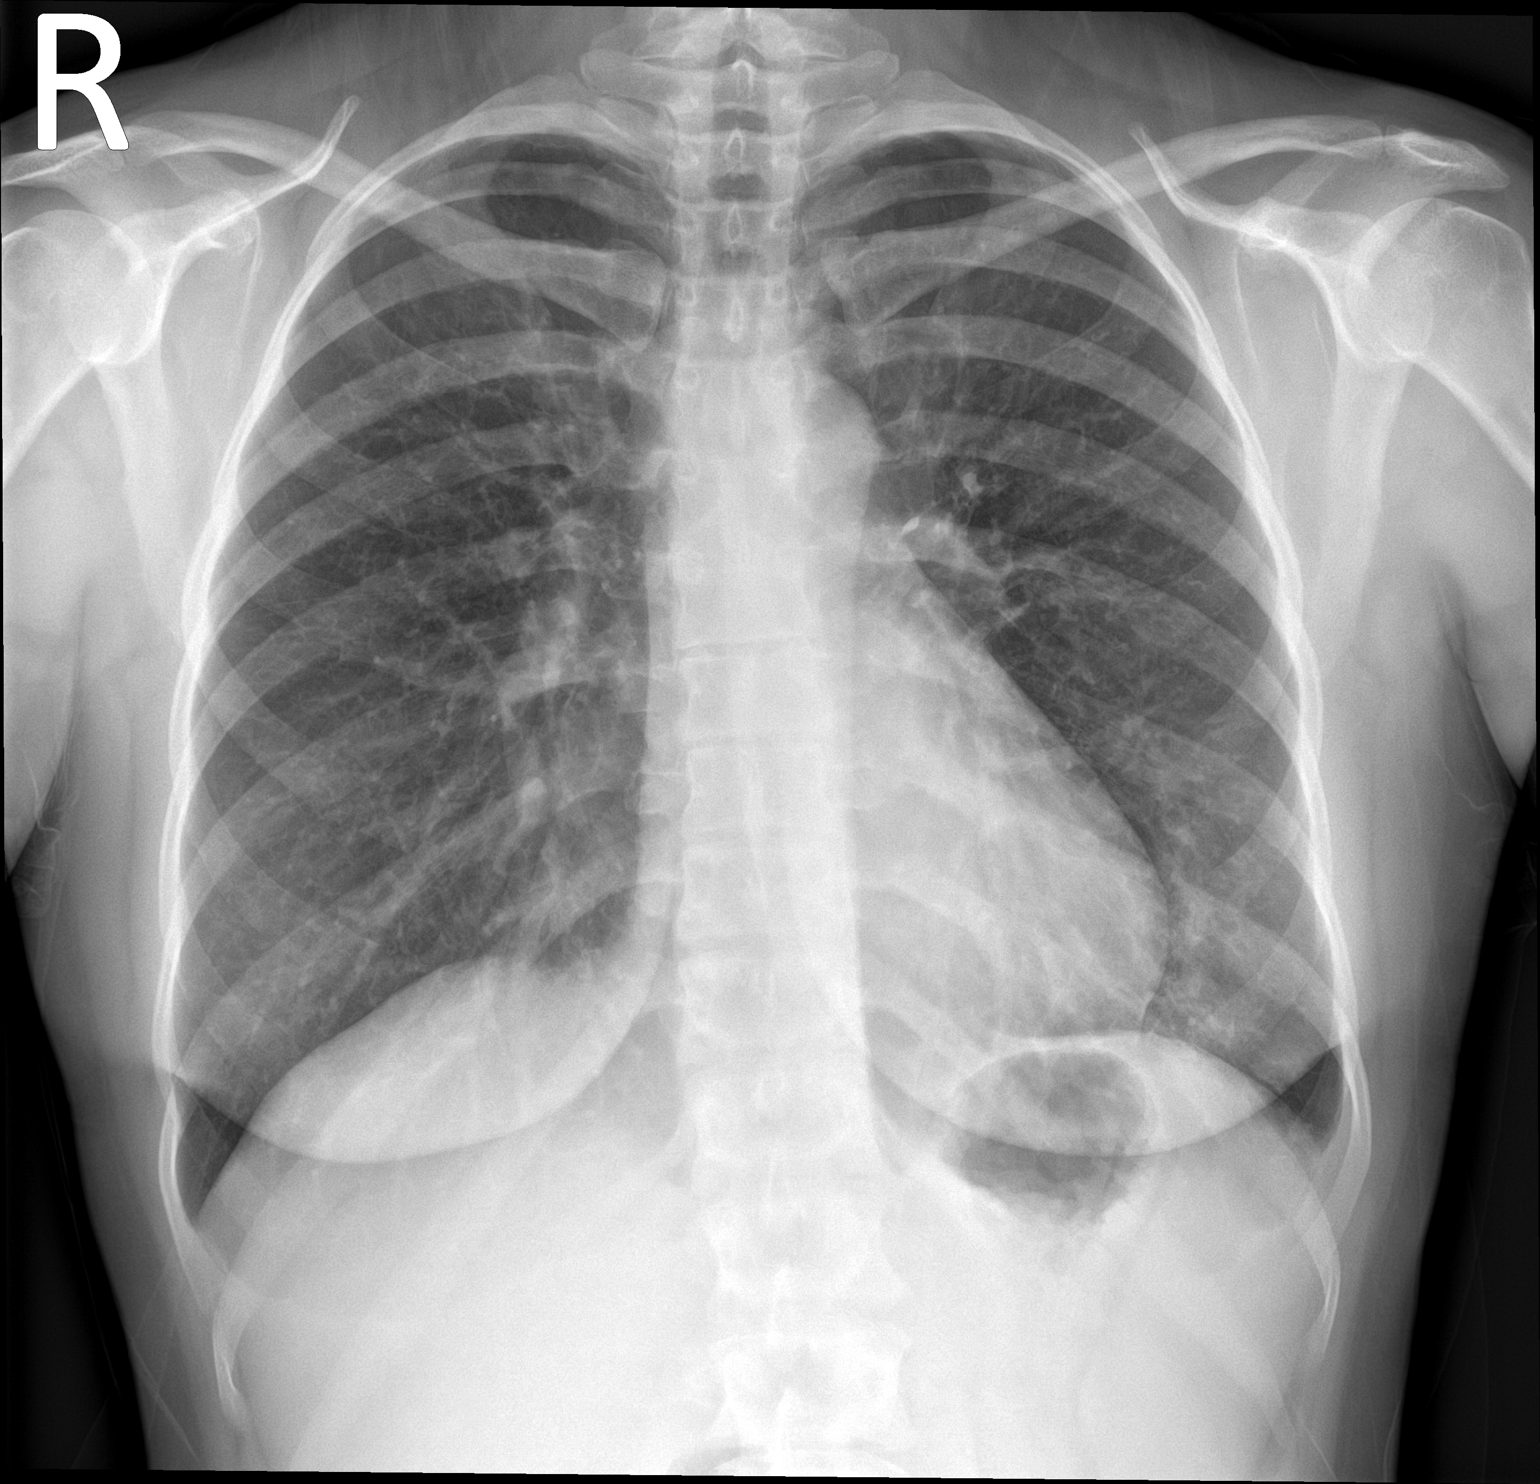

[chest lat]
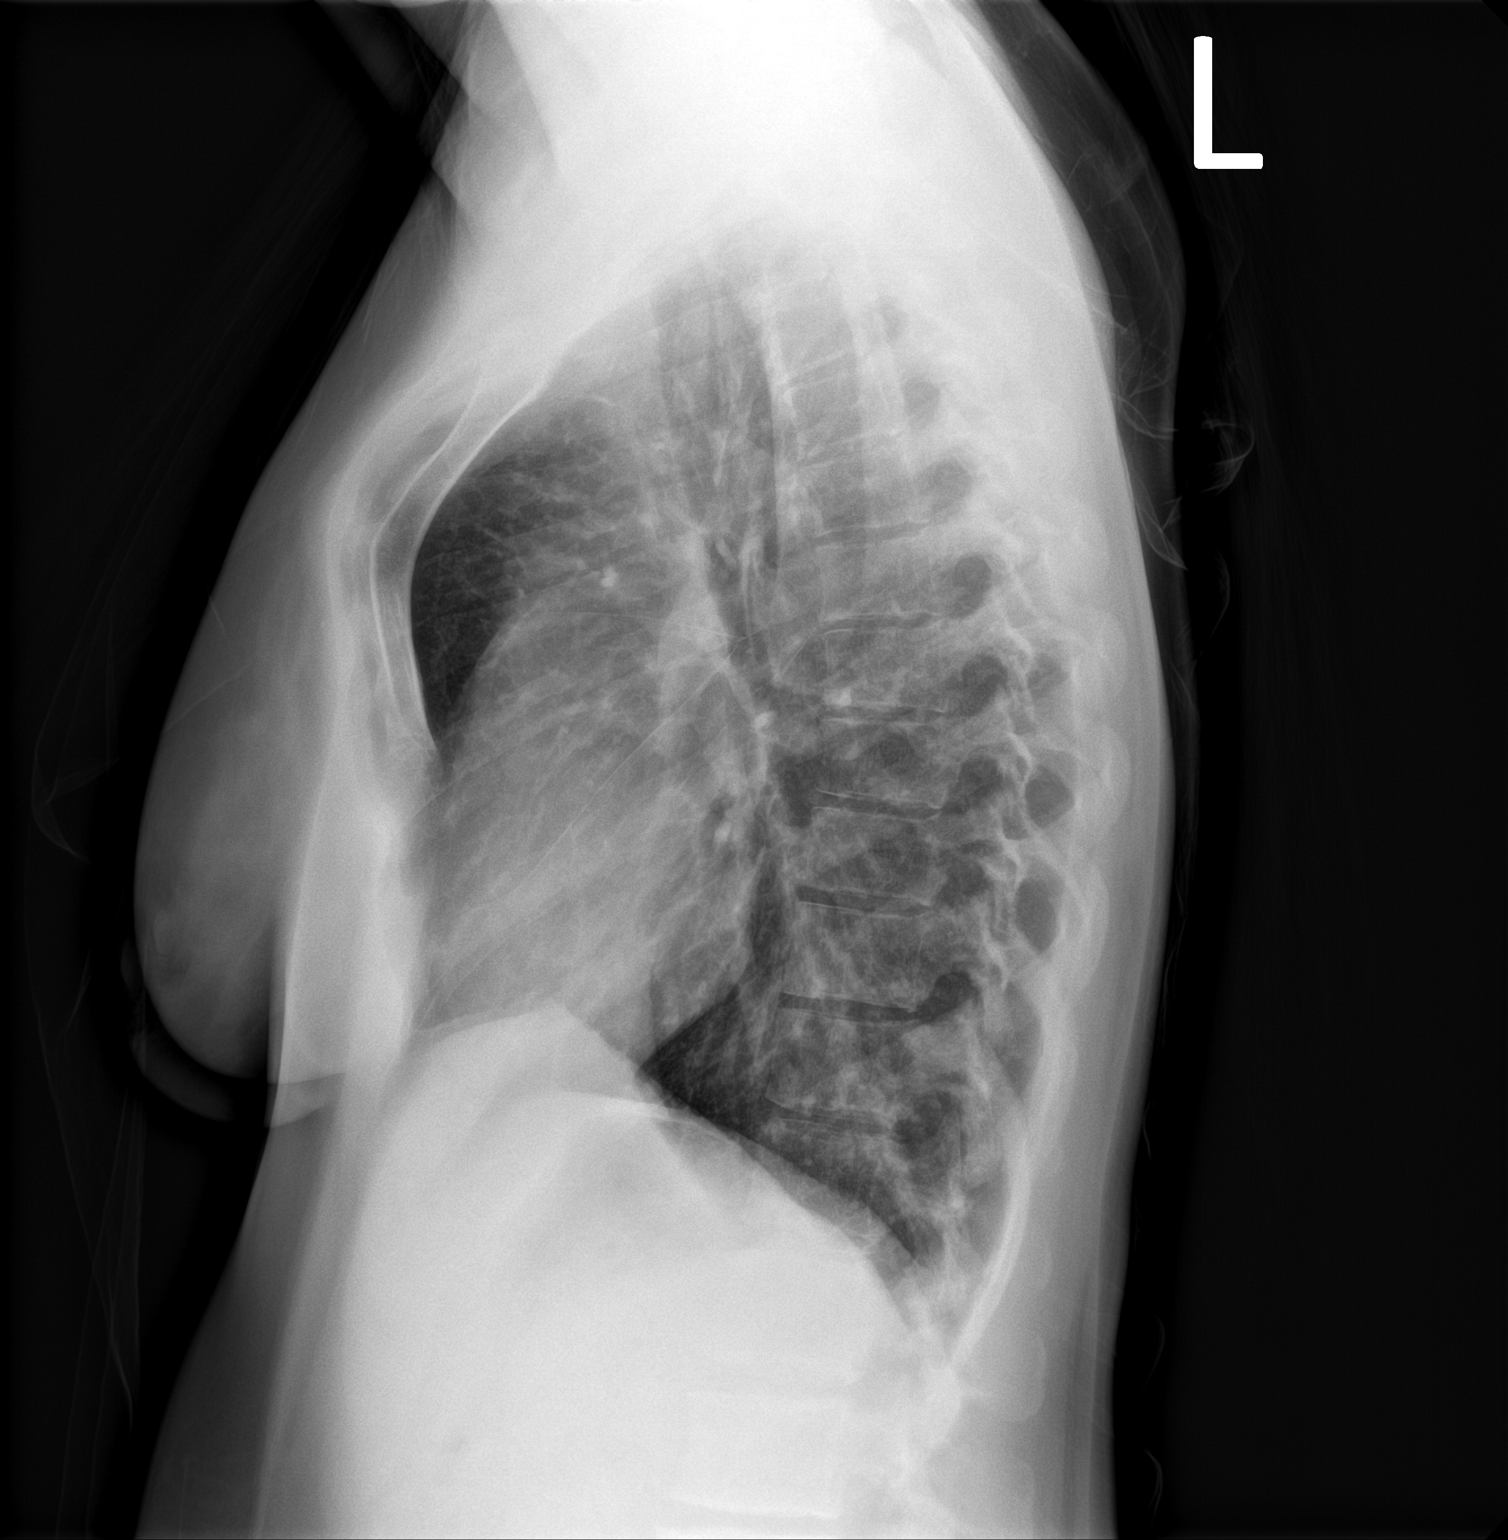

[2 of 2 positions shown; findings below may reference images not displayed]

FINDINGS: Moderate pectus excavatum deformity. Midline trachea. Normal heart
size and mediastinal contours. No pleural effusion or pneumothorax.
Pulmonary interstitial thickening is likely related to the clinical
history of smoking. More confluent left base opacity, including
retrocardiac density on the lateral view, suspicious for lobar
pneumonia.
IMPRESSION: Left lower lobe airspace disease, suspicious for pneumonia.

Peribronchial thickening which may relate to chronic bronchitis or
smoking.

## 2022-10-29 ENCOUNTER — Encounter (HOSPITAL_COMMUNITY): Payer: Self-pay

## 2022-10-29 ENCOUNTER — Emergency Department (HOSPITAL_COMMUNITY)
Admission: EM | Admit: 2022-10-29 | Discharge: 2022-10-29 | Discharge status: Against Medical Advice | Payer: PRIVATE HEALTH INSURANCE | Attending: Emergency Medicine | Admitting: Emergency Medicine

## 2022-10-29 ENCOUNTER — Emergency Department (HOSPITAL_COMMUNITY): Payer: PRIVATE HEALTH INSURANCE

## 2022-10-29 ENCOUNTER — Other Ambulatory Visit: Payer: Self-pay

## 2022-10-29 DIAGNOSIS — R079 Chest pain, unspecified: Secondary | ICD-10-CM | POA: Insufficient documentation

## 2022-10-29 DIAGNOSIS — Z5321 Procedure and treatment not carried out due to patient leaving prior to being seen by health care provider: Secondary | ICD-10-CM | POA: Diagnosis not present

## 2022-10-29 LAB — BASIC METABOLIC PANEL
Anion gap: 10 (ref 5–15)
BUN: 5 mg/dL — ABNORMAL LOW (ref 6–20)
CO2: 24 mmol/L (ref 22–32)
Calcium: 10.3 mg/dL (ref 8.9–10.3)
Chloride: 101 mmol/L (ref 98–111)
Creatinine, Ser: 0.68 mg/dL (ref 0.44–1.00)
GFR, Estimated: >60 mL/min (ref >60)
Glucose, Bld: 90 mg/dL (ref 70–99)
Potassium: 4 mmol/L (ref 3.5–5.1)
Sodium: 135 mmol/L (ref 135–145)

## 2022-10-29 LAB — CBC
HCT: 43.2 % (ref 36.0–46.0)
Hemoglobin: 14.7 g/dL (ref 12.0–15.0)
MCH: 30.5 pg (ref 26.0–34.0)
MCHC: 34 g/dL (ref 30.0–36.0)
MCV: 89.6 fL (ref 80.0–100.0)
Platelets: 293 10*3/uL (ref 150–400)
RBC: 4.82 MIL/uL (ref 3.87–5.11)
RDW: 12.8 % (ref 11.5–15.5)
WBC: 17.4 10*3/uL — ABNORMAL HIGH (ref 4.0–10.5)
nRBC: 0 % (ref 0.0–0.2)

## 2022-10-29 LAB — HCG, SERUM, QUALITATIVE: Preg, Serum: NEGATIVE

## 2022-10-29 LAB — TROPONIN I (HIGH SENSITIVITY): Troponin I (High Sensitivity): 3 ng/L (ref <18)
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
## 2022-10-29 NOTE — ED Notes (Signed)
Pt decided to leave while waiting for a room.  

## 2022-10-29 NOTE — ED Triage Notes (Signed)
Pt states when she inhales she has right sided chest/rib pain upon waking this morning. Pt states she's breathing shallow and not taking deep breaths due to the pain.

## 2022-12-11 ENCOUNTER — Ambulatory Visit: Payer: PRIVATE HEALTH INSURANCE | Admitting: Adult Health

## 2024-03-01 ENCOUNTER — Emergency Department: Payer: Self-pay

## 2024-03-01 ENCOUNTER — Emergency Department
Admission: EM | Admit: 2024-03-01 | Discharge: 2024-03-01 | Disposition: A | Discharge status: Home / Self Care | Payer: Self-pay | Attending: Emergency Medicine | Admitting: Emergency Medicine

## 2024-03-01 ENCOUNTER — Other Ambulatory Visit: Payer: Self-pay

## 2024-03-01 DIAGNOSIS — S0502XA Injury of conjunctiva and corneal abrasion without foreign body, left eye, initial encounter: Secondary | ICD-10-CM

## 2024-03-01 DIAGNOSIS — J45909 Unspecified asthma, uncomplicated: Secondary | ICD-10-CM | POA: Insufficient documentation

## 2024-03-01 DIAGNOSIS — S1093XA Contusion of unspecified part of neck, initial encounter: Secondary | ICD-10-CM | POA: Insufficient documentation

## 2024-03-01 DIAGNOSIS — S0012XA Contusion of left eyelid and periocular area, initial encounter: Secondary | ICD-10-CM | POA: Insufficient documentation

## 2024-03-01 DIAGNOSIS — S0083XA Contusion of other part of head, initial encounter: Secondary | ICD-10-CM | POA: Insufficient documentation

## 2024-03-01 LAB — BASIC METABOLIC PANEL WITH GFR
Anion gap: 9 (ref 5–15)
BUN: 8 mg/dL (ref 6–20)
CO2: 25 mmol/L (ref 22–32)
Calcium: 10.5 mg/dL — ABNORMAL HIGH (ref 8.9–10.3)
Chloride: 106 mmol/L (ref 98–111)
Creatinine, Ser: 0.51 mg/dL (ref 0.44–1.00)
GFR, Estimated: >60 mL/min
Glucose, Bld: 100 mg/dL — ABNORMAL HIGH (ref 70–99)
Potassium: 3.7 mmol/L (ref 3.5–5.1)
Sodium: 139 mmol/L (ref 135–145)

## 2024-03-01 MED ORDER — TETRACAINE HCL 0.5 % OP SOLN
1.0000 [drp] | Freq: Once | OPHTHALMIC | Status: AC
  Administered 2024-03-01: 1 [drp] via OPHTHALMIC
  Filled 2024-03-01: qty 4

## 2024-03-01 MED ORDER — AZITHROMYCIN 250 MG PO TABS: ORAL_TABLET | ORAL | 0 refills | Status: AC

## 2024-03-01 MED ORDER — KETOROLAC TROMETHAMINE 15 MG/ML IJ SOLN
15.0000 mg | Freq: Once | INTRAMUSCULAR | Status: AC
  Administered 2024-03-01: 15 mg via INTRAVENOUS
  Filled 2024-03-01: qty 1

## 2024-03-01 MED ORDER — ERYTHROMYCIN 5 MG/GM OP OINT: TOPICAL_OINTMENT | OPHTHALMIC | 0 refills | Status: AC

## 2024-03-01 MED ORDER — FLUORESCEIN SODIUM 1 MG OP STRP
1.0000 | ORAL_STRIP | Freq: Once | OPHTHALMIC | Status: AC
  Administered 2024-03-01: 1 via OPHTHALMIC
  Filled 2024-03-01: qty 1

## 2024-03-01 MED ORDER — IOHEXOL 350 MG/ML SOLN
75.0000 mL | Freq: Once | INTRAVENOUS | Status: AC | PRN
  Administered 2024-03-01: 75 mL via INTRAVENOUS
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
